# Patient Record
Sex: Male | Born: 1950 | Race: White | Hispanic: No | Marital: Married | Smoking: Former smoker
Health system: Southern US, Community
[De-identification: ages and names within clinical notes are randomized; demographics above are authoritative.]

## PROBLEM LIST (undated history)

## (undated) DIAGNOSIS — F419 Anxiety disorder, unspecified: Secondary | ICD-10-CM

## (undated) DIAGNOSIS — M1990 Unspecified osteoarthritis, unspecified site: Secondary | ICD-10-CM

## (undated) DIAGNOSIS — E7800 Pure hypercholesterolemia, unspecified: Secondary | ICD-10-CM

## (undated) DIAGNOSIS — E119 Type 2 diabetes mellitus without complications: Secondary | ICD-10-CM

## (undated) DIAGNOSIS — G4730 Sleep apnea, unspecified: Secondary | ICD-10-CM

## (undated) DIAGNOSIS — M792 Neuralgia and neuritis, unspecified: Secondary | ICD-10-CM

## (undated) HISTORY — PX: OTHER SURGICAL HISTORY: SHX169

## (undated) HISTORY — DX: Pure hypercholesterolemia, unspecified: E78.00

## (undated) HISTORY — DX: Neuralgia and neuritis, unspecified: M79.2

## (undated) HISTORY — PX: BACK SURGERY: SHX140

## (undated) HISTORY — DX: Type 2 diabetes mellitus without complications: E11.9

---

## 2008-02-21 ENCOUNTER — Encounter: Admission: RE | Admit: 2008-02-21 | Discharge: 2008-02-21 | Payer: Self-pay | Admitting: Family Medicine

## 2008-06-19 ENCOUNTER — Encounter: Admission: RE | Admit: 2008-06-19 | Discharge: 2008-06-19 | Payer: Self-pay | Admitting: Family Medicine

## 2010-04-27 ENCOUNTER — Encounter
Admission: RE | Admit: 2010-04-27 | Discharge: 2010-05-19 | Payer: Self-pay | Source: Home / Self Care | Attending: Neurology | Admitting: Neurology

## 2010-05-22 HISTORY — PX: OTHER SURGICAL HISTORY: SHX169

## 2010-05-23 ENCOUNTER — Encounter
Admission: RE | Admit: 2010-05-23 | Discharge: 2010-06-14 | Payer: Self-pay | Source: Home / Self Care | Attending: Neurology | Admitting: Neurology

## 2010-06-07 ENCOUNTER — Encounter: Admit: 2010-06-07 | Payer: Self-pay | Admitting: Neurology

## 2010-08-18 ENCOUNTER — Other Ambulatory Visit (HOSPITAL_COMMUNITY): Payer: Self-pay | Admitting: Neurology

## 2010-08-18 DIAGNOSIS — M549 Dorsalgia, unspecified: Secondary | ICD-10-CM

## 2010-08-19 ENCOUNTER — Ambulatory Visit (HOSPITAL_COMMUNITY): Payer: Self-pay

## 2010-08-19 ENCOUNTER — Other Ambulatory Visit (HOSPITAL_COMMUNITY): Payer: Self-pay | Admitting: Interventional Radiology

## 2010-08-19 ENCOUNTER — Ambulatory Visit (HOSPITAL_COMMUNITY)
Admission: RE | Admit: 2010-08-19 | Discharge: 2010-08-19 | Disposition: A | Discharge status: Home / Self Care | Payer: 59 | Source: Ambulatory Visit | Attending: Neurology | Admitting: Neurology

## 2010-08-19 DIAGNOSIS — M549 Dorsalgia, unspecified: Secondary | ICD-10-CM

## 2010-08-19 DIAGNOSIS — I770 Arteriovenous fistula, acquired: Secondary | ICD-10-CM

## 2010-08-23 ENCOUNTER — Other Ambulatory Visit (HOSPITAL_COMMUNITY): Payer: Self-pay | Admitting: Interventional Radiology

## 2010-08-23 ENCOUNTER — Encounter (HOSPITAL_COMMUNITY)
Admission: RE | Admit: 2010-08-23 | Discharge: 2010-08-23 | Disposition: A | Discharge status: Home / Self Care | Payer: 59 | Source: Ambulatory Visit | Attending: Interventional Radiology | Admitting: Interventional Radiology

## 2010-08-23 DIAGNOSIS — I10 Essential (primary) hypertension: Secondary | ICD-10-CM

## 2010-08-23 LAB — DIFFERENTIAL
Basophils Relative: 1 % (ref 0–1)
Eosinophils Absolute: 0.4 10*3/uL (ref 0.0–0.7)
Eosinophils Relative: 5 % (ref 0–5)
Monocytes Relative: 10 % (ref 3–12)
Neutro Abs: 3.7 10*3/uL (ref 1.7–7.7)
Neutrophils Relative %: 44 % (ref 43–77)

## 2010-08-23 LAB — CBC
HCT: 44.1 % (ref 39.0–52.0)
Hemoglobin: 15.8 g/dL (ref 13.0–17.0)
MCHC: 35.8 g/dL (ref 30.0–36.0)
MCV: 90 fL (ref 78.0–100.0)
WBC: 8.4 10*3/uL (ref 4.0–10.5)

## 2010-08-23 LAB — BASIC METABOLIC PANEL
Calcium: 9.8 mg/dL (ref 8.4–10.5)
Chloride: 104 mEq/L (ref 96–112)
Creatinine, Ser: 0.81 mg/dL (ref 0.4–1.5)
GFR calc Af Amer: >60 mL/min (ref >60)
Potassium: 4 mEq/L (ref 3.5–5.1)

## 2010-08-23 LAB — APTT: aPTT: 28 seconds (ref 24–37)

## 2010-08-23 LAB — CK TOTAL AND CKMB (NOT AT ARMC): CK, MB: 11.5 ng/mL (ref 0.3–4.0)

## 2010-08-24 ENCOUNTER — Other Ambulatory Visit: Payer: Self-pay | Admitting: Interventional Radiology

## 2010-08-24 ENCOUNTER — Ambulatory Visit (HOSPITAL_COMMUNITY)
Admission: RE | Admit: 2010-08-24 | Discharge: 2010-08-24 | Disposition: A | Discharge status: Home / Self Care | Payer: 59 | Source: Ambulatory Visit | Attending: Interventional Radiology | Admitting: Interventional Radiology

## 2010-08-24 DIAGNOSIS — Z01812 Encounter for preprocedural laboratory examination: Secondary | ICD-10-CM | POA: Insufficient documentation

## 2010-08-24 DIAGNOSIS — Z7901 Long term (current) use of anticoagulants: Secondary | ICD-10-CM | POA: Insufficient documentation

## 2010-08-24 DIAGNOSIS — Z86718 Personal history of other venous thrombosis and embolism: Secondary | ICD-10-CM | POA: Insufficient documentation

## 2010-08-24 DIAGNOSIS — E119 Type 2 diabetes mellitus without complications: Secondary | ICD-10-CM | POA: Insufficient documentation

## 2010-08-24 DIAGNOSIS — G609 Hereditary and idiopathic neuropathy, unspecified: Secondary | ICD-10-CM | POA: Insufficient documentation

## 2010-08-24 DIAGNOSIS — Z0389 Encounter for observation for other suspected diseases and conditions ruled out: Secondary | ICD-10-CM | POA: Insufficient documentation

## 2010-08-24 DIAGNOSIS — I770 Arteriovenous fistula, acquired: Secondary | ICD-10-CM

## 2010-08-24 DIAGNOSIS — I10 Essential (primary) hypertension: Secondary | ICD-10-CM | POA: Insufficient documentation

## 2010-08-24 DIAGNOSIS — I723 Aneurysm of iliac artery: Secondary | ICD-10-CM | POA: Insufficient documentation

## 2010-08-24 LAB — GLUCOSE, CAPILLARY: Glucose-Capillary: 136 mg/dL — ABNORMAL HIGH (ref 70–99)

## 2010-08-24 MED ORDER — IOHEXOL 300 MG/ML  SOLN
450.0000 mL | Freq: Once | INTRAMUSCULAR | Status: AC | PRN
  Administered 2010-08-24: 200 mL via INTRA_ARTERIAL

## 2010-08-25 LAB — CK TOTAL AND CKMB (NOT AT ARMC): CK, MB: 11.3 ng/mL (ref 0.3–4.0)

## 2010-08-29 NOTE — H&P (Signed)
NAME:  Kenneth Thompson, Kenneth Thompson                ACCOUNT NO.:  [id number]  MEDICAL RECORD NO.:  [id number]           PATIENT TYPE:  O  LOCATION:  [facility]                         FACILITY:  [facility]  PHYSICIAN:  Kaleisha Bhargava K. 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  HISTORY OF PRESENT ILLNESS:  This is a pleasant 60 year old male who was referred to Dr. Corliss Skains through the courtesy of Dr. Terrace Arabia for evaluation of a gait disturbance, which the patient noted after having ankle surgery in April 2009.  The patient does not feel that his gait disturbance was related to the ankle surgery itself.  He notes numbness and tingling of his toes, the right greater than the left as well as balance problems and weakness of the lower extremities.  The patient apparently has fallen several times.  He had a nerve conduction study performed on April 25, 2010 that was interpreted as mildly abnormal.  He had an MRI on August 17, 2010 that showed a spinal dural arteriovenous fistula.  The patient was seen in consultation by Dr. Corliss Skains on August 18, 2010.  At that time, a spinal arteriogram was discussed as well as possible embolization for the arteriovenous fistula if felt to be safe and indicated.  The patient presents today for admission.  PAST MEDICAL HISTORY:  Significant for diabetes mellitus, hyperlipidemia, hypertension, low back pain, DJD of the spine, a history of left ankle injury with subsequent surgery in April 2009 performed by Dr. Aldean Baker.  The patient also had a crush injury to his right leg approximately 12 years ago.  He developed a DVT following the injury and was placed on Coumadin and Lovenox for a time.  When seen in consultation on August 18, 2010 by Dr. Corliss Skains, there was some concern that  possibly the patient's statin medication was contributing to his lower extremity symptoms.  We ordered a CPK, however, a CK-MB was performed.  The CK portion was normal although the MB portion was elevated.  This was discussed informally with a local cardiologist who recommended repeating the study along with a troponin to rule out any possible cardiac issues.  Those studies are pending at this time.  PAST SURGICAL HISTORY:  The patient had right ankle surgery in April 2009.  He denies any previous problems with anesthesia.  ALLERGIES:  NO KNOWN DRUG ALLERGIES.  CURRENT MEDICATIONS:  The patient had been on Lipitor; however, he and his wife report that he stopped taking this medication last week after they read some information on the internet.  He is also on potassium chloride, fish oil, aspirin 81 mg daily, metformin which was held, ramipril, atenolol/chlorthalidone, and Tylenol and ibuprofen p.r.n.  SOCIAL HISTORY:  The patient is married.  He and his wife live in [place].  They have 2 sons.  He smokes 2 cigarettes per day and has done so since age 60.  He drinks an occasional mixed drink.  He works  part-time for the [employer] and [employer].  He previously worked  in [employer], water quality.  FAMILY HISTORY:  The patient's mother died from cancer at age 87.  His father died from cancer at age 40.  REVIEW OF SYSTEMS:  Review of systems is completely negative, except for the following:  The patient has had a dry cough.  He has had some recent diarrhea.  He has urinary hesitancy.  He has bilateral lower extremity weakness and numbness.  He has diabetes.  He has gait instability.  PHYSICAL EXAMINATION:  GENERAL:  A pleasant well-developed, well- nourished, 60 year old white male in no acute distress. VITAL SIGNS:  Blood pressure 132/81, pulse 72, respirations 18, temperature 98.2, oxygen saturation 96%. HEENT:  Unremarkable. Heart:  Regular rate and rhythm. LUNGS:  Decreased  breath sounds, although they were clear. ABDOMEN:  Soft, nontender. EXTREMITIES:  Pulses noted to be intact with trace edema.   His airway was rated at I.  His ASA scale was rated at III. NEUROLOGIC:  The patient is alert and oriented and able to follow commands.  Cranial nerves II through XII are grossly intact.  Sensation was intact to light touch, although somewhat diminished in the feet. Cerebellar testing was intact.  Motor strength was 5/5 throughout.  LABORATORY DATA:  A presurgical screen was positive for Staph aureus, negative for MRSA.  A basic metabolic panel revealed a BUN of 13, creatinine 0.81, potassium 4.0, glucose 109, GFR was greater than 60.  A PTT was 28.  A protime was 13.1 with an INR of 0.97.  A CBC revealed hemoglobin 15.8, hematocrit 44.1, WBCs 8.4 thousand, platelets 193,000. A CPK-MB revealed a CK of 188, the MB portion was elevated at 11.5, the relative index was elevated at 6.1.  There is a repeat CK-MB as well as a repeat troponin pending.  IMPRESSION: 1. Lower extremity weakness with an MRI performed on August 17, 2010     revealing a spinal dural arteriovenous fistula. 2. Diabetes mellitus. 3. Hyperlipidemia. 4. Hypertension. 5. History of low back pain. 6. Degenerative joint disease of the spine. 7. History of a crush injury to his right lower extremity     approximately 12 years ago with subsequent DVT treated with     Coumadin and Lovenox. 8. History of right ankle injury with surgery in April 2009 performed     by Dr. Aldean Baker. 9. Ongoing tobacco use. 10.Elevated CK-MB with repeat pending.     Delton See, P.A.   ______________________________ Grandville Silos. Corliss Skains, M.D.    DR/MEDQ  D:  08/24/2010  T:  08/24/2010  Job:  [id number]  cc:   Jonny Ruiz L. Rendall, M.D. Gretta Arab Valentina Lucks, M.D. Levert Feinstein, MD  Electronically Signed by Delton See P.A. on 08/25/2010 10:03:09 AM Electronically Signed by Julieanne Cotton M.D. on 08/29/2010  01:26:01 PM

## 2010-08-30 ENCOUNTER — Other Ambulatory Visit (HOSPITAL_COMMUNITY): Payer: Self-pay | Admitting: Interventional Radiology

## 2010-08-30 ENCOUNTER — Ambulatory Visit (HOSPITAL_COMMUNITY)
Admission: RE | Admit: 2010-08-30 | Discharge: 2010-08-30 | Disposition: A | Discharge status: Home / Self Care | Payer: 59 | Source: Ambulatory Visit | Attending: Interventional Radiology | Admitting: Interventional Radiology

## 2010-08-30 DIAGNOSIS — I770 Arteriovenous fistula, acquired: Secondary | ICD-10-CM

## 2010-08-31 LAB — POCT ACTIVATED CLOTTING TIME: Activated Clotting Time: 158 seconds

## 2012-08-09 ENCOUNTER — Other Ambulatory Visit: Payer: Self-pay | Admitting: Neurology

## 2012-08-12 ENCOUNTER — Other Ambulatory Visit: Payer: Self-pay | Admitting: Neurology

## 2012-09-07 ENCOUNTER — Other Ambulatory Visit: Payer: Self-pay | Admitting: Neurology

## 2013-01-11 ENCOUNTER — Encounter: Payer: Self-pay | Admitting: Nurse Practitioner

## 2013-01-15 ENCOUNTER — Encounter: Payer: Self-pay | Admitting: Nurse Practitioner

## 2013-01-15 ENCOUNTER — Ambulatory Visit (INDEPENDENT_AMBULATORY_CARE_PROVIDER_SITE_OTHER): Payer: Medicare Other | Admitting: Nurse Practitioner

## 2013-01-15 VITALS — BP 118/72 | HR 55 | Ht 76.0 in | Wt 283.0 lb

## 2013-01-15 DIAGNOSIS — R269 Unspecified abnormalities of gait and mobility: Secondary | ICD-10-CM

## 2013-01-15 DIAGNOSIS — R209 Unspecified disturbances of skin sensation: Secondary | ICD-10-CM

## 2013-01-15 DIAGNOSIS — M5104 Intervertebral disc disorders with myelopathy, thoracic region: Secondary | ICD-10-CM | POA: Insufficient documentation

## 2013-01-15 NOTE — Progress Notes (Signed)
Reason for visit followup for neuropathic pain involving bilateral lower extremities  HPI: Kenneth Thompson,  62 year old Caucasian  male, returns for followup with his wife .Last seen 07/18/12.   01/15/13.  Pt denies urinary symptoms today, continues with burning pain on the soles of the feet that is well controlled with meds. He continues to have edema in the right ankle previous ankle reconstruction surgery. Is currently not exercising but has lost 15 lbs by dieting.   Patient has disability. He also has history of hypertension and diabetes and hyperlipidemia. He denies any falls since last seen, he occasionally uses a cane     HISTORY: 09/01/10 MRI thoracic spine From T5-T11, there is intramedullary spinal cord edema, with enhancing intradural dilated venous plexus posteriorly.  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  Inflammatory and neoplastic etiologies are less likely. In comparison to the prior MRI from 08/10/10, the spinal cord edema is slightly increased. He had angiogram by Dr. Launa Flight, angiographically no evidence of early arteriovenous shunting in the spinous axis from the craniovertebral junction to the lumbosacral region noted. No abnormal early prominent venous channels are seen in the midline or in the paramidline region of the cranial spinous axis.  patient remains symptomatic, no incontinence, but complains bilateral feet burning pain, slow progress gait difficulty, no arm symptoms.  He underwent thoracic AVM malformation at [facility] by Dr. Arlyss Queen, postsurgically, he can ambulate better, but he gradually developed bilateral lower extremity burning achy pain, bandlike sensation around his lower abdomen, urinary urgency,   He suffered right ankle twisting injury at the beginning of 2009., his right ankle underwent inversion and plantar flexion.  He later underwent  right lateral ankle reconstruction surgery, "things never been the same ever  since"  MRI lumbar at [facility] image in 02/2010 showed mild DJD, but no foraminal and canal stenosis.   ROS:  Swelling in the legs, snoring, joint pain joint swelling   Medications Current Outpatient Prescriptions on File Prior to Visit  Medication Sig Dispense Refill  . aspirin 81 MG tablet Take 81 mg by mouth daily.      Marland Kitchen atenolol-chlorthalidone (TENORETIC) 50-25 MG per tablet Take 1 tablet by mouth daily.      . baclofen (LIORESAL) 10 MG tablet TITRATING TO TAKE 1 TABLET BY MOUTH 3 TIMES DAILY.  90 tablet  11  . clonazePAM (KLONOPIN) 1 MG tablet Take 1 mg by mouth at bedtime.      . fish oil-omega-3 fatty acids 1000 MG capsule Take by mouth daily.      Marland Kitchen gabapentin (NEURONTIN) 600 MG tablet TAKE TWO TABLETS BY MOUTH THREE TIMES DAILY  180 tablet  11  . lidocaine (LMX) 4 % cream Apply topically 3 (three) times daily.      . metFORMIN (GLUCOPHAGE) 500 MG tablet Take 500 mg by mouth 2 (two) times daily with a meal.       No current facility-administered medications on file prior to visit.    Allergies No Known Allergies  Physical Exam General: well developed, well nourished, obese male  in no evident distress Head: head normocephalic and atraumatic. Oropharynx benign Neck: supple with no carotid  bruits Cardiovascular: regular rate and rhythm, no murmurs  Neurologic Exam Mental Status: Awake and fully alert. Oriented to place and time. Follows all commands. Speech and language normal.   Cranial Nerves: Fundoscopic exam reveals sharp disc margins. Pupils equal, briskly reactive to light. Extraocular movements full without nystagmus. Visual  fields full to confrontation. Hearing intact and symmetric to finger snap. Facial sensation intact. Face, tongue, palate move normally and symmetrically. Neck flexion and extension normal.  Motor: Normal bulk and tone. Normal strength in all tested extremity muscles except mild bilateral lower extremities as to 30, mild limitation of right ankle range  of motion Sensory.: Decreased light touch and pinprick to about the ankle level, decreased vibratory to ankle level   Coordination: Rapid alternating movements normal in all extremities. Finger-to-nose and heel-to-shin performed accurately bilaterally. No dysmetria Gait and Station: Arises from chair without difficulty. Wide-based stiff, atalgic gait.   Reflexes: 2+ and symmetric except 3+ patellar and absent Achilles. Toes downgoing.     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 Neuropathic pain involving the  lower extremities     PLAN: Continue Gabapentin at current dose Continue Baclofen at current dose Continue Gemfibrozil at current dose Please have labs sent to our office Follow up about abnormal sleep study,  Has never been fitted for CPAP Congratulated on weight loss F/U in 6 months Kenneth Thompson, GNP-BC APRN

## 2013-01-15 NOTE — Patient Instructions (Addendum)
Continue Gabapentin at current dose Continue Baclofen at current dose Continue Gemfibrozil at current dose Please have labs sent to our office Follow up about abnormal sleep study F/U in 6 months

## 2013-01-17 NOTE — Progress Notes (Signed)
I reviewed note and agree with plan.   Suanne Marker, MD 01/17/2013, 5:06 PM Certified in Neurology, Neurophysiology and Neuroimaging  [facility] [address], [address] [place], [place] [address] [phone]

## 2013-07-18 ENCOUNTER — Ambulatory Visit: Payer: Medicare Other | Admitting: Neurology

## 2013-08-08 ENCOUNTER — Other Ambulatory Visit: Payer: Self-pay | Admitting: Neurology

## 2013-09-08 ENCOUNTER — Other Ambulatory Visit: Payer: Self-pay | Admitting: Neurology

## 2013-09-15 ENCOUNTER — Ambulatory Visit (INDEPENDENT_AMBULATORY_CARE_PROVIDER_SITE_OTHER): Payer: Medicare Other | Admitting: Neurology

## 2013-09-15 ENCOUNTER — Encounter: Payer: Self-pay | Admitting: Neurology

## 2013-09-15 ENCOUNTER — Encounter (INDEPENDENT_AMBULATORY_CARE_PROVIDER_SITE_OTHER): Payer: Self-pay

## 2013-09-15 VITALS — BP 111/68 | HR 69 | Ht 76.0 in | Wt 284.0 lb

## 2013-09-15 DIAGNOSIS — R209 Unspecified disturbances of skin sensation: Secondary | ICD-10-CM

## 2013-09-15 DIAGNOSIS — R269 Unspecified abnormalities of gait and mobility: Secondary | ICD-10-CM

## 2013-09-15 DIAGNOSIS — M5104 Intervertebral disc disorders with myelopathy, thoracic region: Secondary | ICD-10-CM

## 2013-09-15 MED ORDER — NORTRIPTYLINE HCL 25 MG PO CAPS: ORAL_CAPSULE | ORAL | Status: DC

## 2013-09-15 MED ORDER — GABAPENTIN 600 MG PO TABS: 1200.0000 mg | ORAL_TABLET | Freq: Three times a day (TID) | ORAL | Status: DC

## 2013-09-15 MED ORDER — BACLOFEN 10 MG PO TABS: 10.0000 mg | ORAL_TABLET | Freq: Three times a day (TID) | ORAL | Status: DC

## 2013-09-15 NOTE — Progress Notes (Signed)
Reason for visit followup for neuropathic pain involving bilateral lower extremities  HPI: [name],  63 year old Caucasian  male, returns for followup with his wife .Last seen 07/18/12.   01/15/13.  Pt denies urinary symptoms today, continues with burning pain on the soles of the feet that is well controlled with meds. He continues to have edema in the right ankle previous ankle reconstruction surgery. Is currently not exercising but has lost 15 lbs by dieting.   Patient has disability. He also has history of hypertension and diabetes and hyperlipidemia. He denies any falls since last seen, he occasionally uses a cane     HISTORY:   There is a 63 years old gentleman presented with gradual onset of gait difficulty since right ankle injury at the beginning of 2009., his right ankle underwent inversion and plantar flexion.  He later underwent  right lateral ankle reconstruction surgery, "things never been the same ever since"    He had gradual onset worsening gait difficulty, no incontinence  MRI lumbar at [facility] image in 02/2010 showed mild DJD, but no foraminal and canal stenosis.  MRI thoracic spine showed T5-T11, there is intramedullary spinal cord edema, with enhancing intradural dilated venous plexus posteriorly.  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   He had angiogram by Dr. Launa Flighteveshawer, angiographically no evidence of early arteriovenous shunting in the spinous axis from the craniovertebral junction to the lumbosacral region noted. No abnormal early prominent venous channels are seen in the midline or in the paramidline region of the cranial spinous axis.   He underwent thoracic AVM malformation [facility] by Dr. Arlyss QueenZomorodi, postsurgically, he can ambulate better, but he gradually developed bilateral lower extremity burning achy pain, bandlike sensation around his lower abdomen, urinary urgency  UPDATE April  27th 2015: He is in a research study related to diabetes, he has occasionally bowel accident when he has diarrhea, he has frequent urinations, occasionally difficulty emptying his bladder,  He now complains eight-month history of worsening bilateral feet paresthesia, at nighttime she will freezing, during the daytime burning sensation, also complains of worsening gait difficulty.  He is taking Neurontin 600 mg 2 tablets 3 times a day, which does help his back paresthesia, he is also taking baclofen 10 mg 3 times a day,  ROS:  Cold intolerance, heat intolerance, diarrhea, restless leg, apnea, frequent awakening, daytime sleepiness, incontinence of bladder, walking difficulty, memory loss, dizziness, numbness, weakness, tremor, agitation, behavior problem, confusion, depression anxiety  Medications Current Outpatient Prescriptions on File Prior to Visit  Medication Sig Dispense Refill  . aspirin 81 MG tablet Take 81 mg by mouth daily.      Marland Kitchen. atenolol-chlorthalidone (TENORETIC) 50-25 MG per tablet Take 1 tablet by mouth daily.      . baclofen (LIORESAL) 10 MG tablet Take 1 tablet (10 mg total) by mouth 3 (three) times daily.  90 tablet  1  . fish oil-omega-3 fatty acids 1000 MG capsule Take by mouth daily.      Marland Kitchen. gabapentin (NEURONTIN) 600 MG tablet Take two tablets by mouth three times daily  180 tablet  0  . gemfibrozil (LOPID) 600 MG tablet       . lidocaine (LMX) 4 % cream Apply topically 3 (three) times daily.      Marland Kitchen. losartan (COZAAR) 100 MG tablet       . metFORMIN (GLUCOPHAGE) 500 MG tablet Take 500 mg by mouth 2 (two) times daily with  a meal.      . ONE TOUCH ULTRA TEST test strip        No current facility-administered medications on file prior to visit.    Allergies No Known Allergies  Physical Exam General: well developed, well nourished, obese male  in no evident distress Head: head normocephalic and atraumatic. Oropharynx benign Neck: supple with no carotid   bruits Cardiovascular: regular rate and rhythm, no murmurs  Neurologic Exam Mental Status: Awake and fully alert. Oriented to place and time. Follows all commands. Speech and language normal.   Cranial Nerves: Fundoscopic exam reveals sharp disc margins. Pupils equal, briskly reactive to light. Extraocular movements full without nystagmus. Visual fields full to confrontation. Hearing intact and symmetric to finger snap. Facial sensation intact. Face, tongue, palate move normally and symmetrically. Neck flexion and extension normal.  Motor: Normal bulk and tone. Normal strength in all tested extremity muscles except mild bilateral lower extremities spasticity,imitation of right ankle range of motion, bilateral toe flexion Sensory.: Decreased light touch and pinprick to about the ankle level, decreased vibratory to  mid shin level   Coordination: Rapid alternating movements normal in all extremities. Finger-to-nose and heel-to-shin performed accurately bilaterally. No dysmetria Gait and Station: Arises from chair without difficulty. Wide-based stiff, atalgic gait.   Reflexes: 2+ and symmetric except 3+ patellar and absent Achilles. Toes downgoing.   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  Neuropathic pain involving bilateral lower extremities  Continue Gabapentin 600 mg 2 tablets 3 times a day  Continue Baclofen  10 mg 3 times a day  Add on nortriptyline titrating to 25 mg 2 tablets every night Physical therapy BOTOX for bilateral toes flexion MRI thoracic and lumbar spine

## 2013-09-17 ENCOUNTER — Ambulatory Visit
Admission: RE | Admit: 2013-09-17 | Discharge: 2013-09-17 | Disposition: A | Discharge status: Home / Self Care | Payer: BC Managed Care – PPO | Source: Ambulatory Visit | Attending: Neurology | Admitting: Neurology

## 2013-09-17 DIAGNOSIS — R209 Unspecified disturbances of skin sensation: Secondary | ICD-10-CM

## 2013-09-17 DIAGNOSIS — R269 Unspecified abnormalities of gait and mobility: Secondary | ICD-10-CM

## 2013-09-17 DIAGNOSIS — M5104 Intervertebral disc disorders with myelopathy, thoracic region: Secondary | ICD-10-CM

## 2013-09-17 MED ORDER — GADOBENATE DIMEGLUMINE 529 MG/ML IV SOLN
20.0000 mL | Freq: Once | INTRAVENOUS | Status: AC | PRN
  Administered 2013-09-17: 20 mL via INTRAVENOUS

## 2013-09-23 ENCOUNTER — Telehealth: Payer: Self-pay | Admitting: *Deleted

## 2013-09-23 NOTE — Telephone Encounter (Signed)
Patient calling requesting MRI results.  °

## 2013-09-24 ENCOUNTER — Telehealth: Payer: Self-pay | Admitting: Neurology

## 2013-09-24 NOTE — Telephone Encounter (Signed)
I have called, left message ,[name], please call patient again,  MRI of lumbar showed degenerative disc disease result significant canal, or foraminal stenosis, MRI of thoracic spine showed postsurgical changes, a few dilated blood vessels,  No change in treatment plan,   MRI lumbar spine (without) demonstrating:  1. Multi-level facet hypertrophy, mild disc bulging at L1-2, L2-3, L4-5, with no spinal stenosis or foraminal narrowing.  2. Incidental small right renal cyst (9mm).  Posterior surgical decompression changes at T5 level.  2. Small, serpiginous flow void signals noted in the posterior intra-dural extramedullary CSF space, from T3 to T8, consistent with spinal AVM.  3. No intrinsic, compressive or abnormal enhancing spinal cord lesions.  4. Compared to MRI from 08/10/10, spinal cord edema has resolved, fewer dilated blood vessels from AVM are seen, and post-surgical changes are a new finding.

## 2013-09-24 NOTE — Telephone Encounter (Signed)
Patient requesting a call back regarding his MRI results. He did come by the office on Tuesday and picked up the results but he cannot understand the terminology. He would like someone to explain the results to him. Please call.

## 2013-09-25 ENCOUNTER — Other Ambulatory Visit: Payer: 59

## 2013-09-26 NOTE — Telephone Encounter (Signed)
Spoke to patient and relayed MRI lumbar and thoracic results, per Dr. Terrace ArabiaYan.  Patient expressed understanding, and is waiting for appointment for Botox once approved.

## 2013-10-14 ENCOUNTER — Ambulatory Visit: Payer: BC Managed Care – PPO | Attending: Neurology

## 2013-10-14 DIAGNOSIS — R262 Difficulty in walking, not elsewhere classified: Secondary | ICD-10-CM | POA: Insufficient documentation

## 2013-10-14 DIAGNOSIS — IMO0001 Reserved for inherently not codable concepts without codable children: Secondary | ICD-10-CM | POA: Diagnosis present

## 2013-10-14 DIAGNOSIS — R279 Unspecified lack of coordination: Secondary | ICD-10-CM | POA: Insufficient documentation

## 2013-10-14 DIAGNOSIS — R269 Unspecified abnormalities of gait and mobility: Secondary | ICD-10-CM | POA: Diagnosis not present

## 2013-10-14 DIAGNOSIS — R209 Unspecified disturbances of skin sensation: Secondary | ICD-10-CM | POA: Diagnosis not present

## 2013-10-15 ENCOUNTER — Ambulatory Visit: Payer: BC Managed Care – PPO

## 2013-10-15 DIAGNOSIS — IMO0001 Reserved for inherently not codable concepts without codable children: Secondary | ICD-10-CM | POA: Diagnosis not present

## 2013-10-20 ENCOUNTER — Encounter: Payer: 59 | Admitting: Physical Therapy

## 2013-10-21 ENCOUNTER — Ambulatory Visit: Payer: BC Managed Care – PPO | Attending: Neurology | Admitting: Physical Therapy

## 2013-10-21 DIAGNOSIS — R279 Unspecified lack of coordination: Secondary | ICD-10-CM | POA: Diagnosis not present

## 2013-10-21 DIAGNOSIS — IMO0001 Reserved for inherently not codable concepts without codable children: Secondary | ICD-10-CM | POA: Insufficient documentation

## 2013-10-21 DIAGNOSIS — R269 Unspecified abnormalities of gait and mobility: Secondary | ICD-10-CM | POA: Insufficient documentation

## 2013-10-21 DIAGNOSIS — R209 Unspecified disturbances of skin sensation: Secondary | ICD-10-CM | POA: Diagnosis not present

## 2013-10-21 DIAGNOSIS — R262 Difficulty in walking, not elsewhere classified: Secondary | ICD-10-CM | POA: Diagnosis not present

## 2013-10-22 ENCOUNTER — Ambulatory Visit: Payer: BC Managed Care – PPO

## 2013-10-22 DIAGNOSIS — IMO0001 Reserved for inherently not codable concepts without codable children: Secondary | ICD-10-CM | POA: Diagnosis not present

## 2013-10-27 ENCOUNTER — Ambulatory Visit: Payer: BC Managed Care – PPO

## 2013-10-29 ENCOUNTER — Ambulatory Visit: Payer: BC Managed Care – PPO

## 2013-10-29 DIAGNOSIS — IMO0001 Reserved for inherently not codable concepts without codable children: Secondary | ICD-10-CM | POA: Diagnosis not present

## 2013-11-03 ENCOUNTER — Ambulatory Visit: Payer: BC Managed Care – PPO

## 2013-11-03 DIAGNOSIS — IMO0001 Reserved for inherently not codable concepts without codable children: Secondary | ICD-10-CM | POA: Diagnosis not present

## 2013-11-04 ENCOUNTER — Telehealth: Payer: Self-pay | Admitting: Neurology

## 2013-11-04 NOTE — Telephone Encounter (Signed)
Called patient and informed him that his insurance denied the botox injections. He understood.

## 2013-11-05 ENCOUNTER — Ambulatory Visit: Payer: BC Managed Care – PPO | Admitting: Physical Therapy

## 2013-11-05 DIAGNOSIS — IMO0001 Reserved for inherently not codable concepts without codable children: Secondary | ICD-10-CM | POA: Diagnosis not present

## 2013-11-10 ENCOUNTER — Ambulatory Visit: Payer: BC Managed Care – PPO | Admitting: Physical Therapy

## 2013-11-10 DIAGNOSIS — IMO0001 Reserved for inherently not codable concepts without codable children: Secondary | ICD-10-CM | POA: Diagnosis not present

## 2013-11-17 ENCOUNTER — Ambulatory Visit: Payer: BC Managed Care – PPO

## 2014-03-06 ENCOUNTER — Other Ambulatory Visit: Payer: Self-pay

## 2014-08-23 ENCOUNTER — Other Ambulatory Visit: Payer: Self-pay | Admitting: Neurology

## 2014-09-20 ENCOUNTER — Other Ambulatory Visit: Payer: Self-pay | Admitting: Neurology

## 2014-09-21 ENCOUNTER — Telehealth: Payer: Self-pay | Admitting: Neurology

## 2014-09-21 MED ORDER — GABAPENTIN 600 MG PO TABS: 1200.0000 mg | ORAL_TABLET | Freq: Three times a day (TID) | ORAL | Status: DC

## 2014-09-21 NOTE — Telephone Encounter (Signed)
Patient is calling as he is out of his Rx gabapentin 600 mg.  He has an appointment scheduled for 09/28/2014 and is asking for a full Rx refill. Please call.

## 2014-09-21 NOTE — Telephone Encounter (Signed)
Rx has been sent.  I called back to advise.  Patient is aware.  

## 2014-09-28 ENCOUNTER — Ambulatory Visit (INDEPENDENT_AMBULATORY_CARE_PROVIDER_SITE_OTHER): Payer: BLUE CROSS/BLUE SHIELD | Admitting: Neurology

## 2014-09-28 ENCOUNTER — Encounter: Payer: Self-pay | Admitting: Neurology

## 2014-09-28 VITALS — BP 110/69 | HR 68 | Ht 76.0 in | Wt 290.0 lb

## 2014-09-28 DIAGNOSIS — R269 Unspecified abnormalities of gait and mobility: Secondary | ICD-10-CM

## 2014-09-28 DIAGNOSIS — R209 Unspecified disturbances of skin sensation: Secondary | ICD-10-CM | POA: Diagnosis not present

## 2014-09-28 DIAGNOSIS — M5104 Intervertebral disc disorders with myelopathy, thoracic region: Secondary | ICD-10-CM | POA: Diagnosis not present

## 2014-09-28 MED ORDER — OXCARBAZEPINE 150 MG PO TABS: 150.0000 mg | ORAL_TABLET | Freq: Two times a day (BID) | ORAL | Status: DC

## 2014-09-28 MED ORDER — GABAPENTIN 600 MG PO TABS: 1200.0000 mg | ORAL_TABLET | Freq: Three times a day (TID) | ORAL | Status: DC

## 2014-09-28 MED ORDER — BACLOFEN 10 MG PO TABS: 20.0000 mg | ORAL_TABLET | Freq: Three times a day (TID) | ORAL | Status: DC

## 2014-09-28 MED ORDER — CELECOXIB 100 MG PO CAPS: 100.0000 mg | ORAL_CAPSULE | Freq: Two times a day (BID) | ORAL | Status: DC

## 2014-09-28 NOTE — Progress Notes (Signed)
Reports his bilateral, lower extremity pain has become much worse. He is unable to ambulate without a cane.  He had a recent fall off his porch last week.  He is taking gabapentin 3600mg  and ibuprofen up to 4800mg  daily.  He is also using hydrocodone as needed. //mck,rn

## 2014-09-28 NOTE — Progress Notes (Signed)
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  MRI thoracic spine showed T5-T11, there is intramedullary spinal cord edema, with enhancing intradural dilated venous plexus posteriorly. 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  He had angiogram by Dr. Diamantina Monks, angiographically no evidence of early arteriovenous shunting in the spinous axis from the craniovertebral junction to the lumbosacral region noted. No abnormal early prominent venous channels are seen in the midline or in the paramidline region of the cranial spinous axis.   He underwent thoracic AVM malformation introvascular embolic surgery at [facility] in 2012 by Dr. Francesca Oman, postsurgically, he can ambulate better, but he gradually developed bilateral lower extremity burning achy pain, bandlike sensation around his lower abdomen, urinary urgency   He also has history of hypertension, diabetes, obesity,  He is currently taking Neurontin 600 mg 2 tablets 3 times a day, baclofen 10 mg 3 times a day, he complains of gradual worsening eye lateral lower extremity achy pain, like he is walking on gravels, gait difficulty, urinary urgency, bowel urgency, occasionally incontinence. He has tried nortriptyline, up to 20 mg daily, not sure about the benefit   He has been self dosing him with titrating dose of ibuprofen, up to 4800 mg daily, tends to spend a lot of time raising his leg up, or sleeping in bed,  We have reviewed MRI thoracic in April 2015, Posterior surgical decompression changes at T5 level. Small, serpiginous flow void signals noted in the posterior intra-dural extramedullary CSF space, from T3 to T8,  consistent with spinal AVM. No intrinsic, compressive or abnormal enhancing spinal cord lesions. Compared to MRI from 08/10/10, spinal cord edema has resolved, fewer dilated blood vessels from AVM are seen, and post-surgical changes are a new finding.  MRI of lumbar spine showed multilevel degenerative disc disease, no significant foraminal, or canal stenosis.  REVIEW OF SYSTEMS: Full 14 system review of systems performed and notable only for cold intolerance, heat intolerance, diarrhea, apnea, frequent awakening, daytime sleepiness, snoring, incontinence of bladder, frequent urinations, joints pain, low back pain, achy muscles, muscle cramps, walking difficulties, memory loss, dizziness, numbness, weakness, tremors, behavior problem, confusion, anxiety  ALLERGIES: No Known Allergies  HOME MEDICATIONS: Current Outpatient Prescriptions  Medication Sig Dispense Refill  . aspirin 81 MG tablet Take 81 mg by mouth daily.    Marland Kitchen atenolol-chlorthalidone (TENORETIC) 50-25 MG per tablet Take 1 tablet by mouth daily.    . baclofen (LIORESAL) 10 MG tablet Take 1 tablet (10 mg total) by mouth 3 (three) times daily. 90 tablet 12  . fish oil-omega-3 fatty acids 1000 MG capsule Take by mouth daily.    Marland Kitchen gabapentin (NEURONTIN) 600 MG tablet Take 2 tablets (1,200 mg total) by mouth 3 (three) times daily. 180 tablet 0  . gemfibrozil (LOPID) 600 MG tablet     . HYDROcodone-acetaminophen (NORCO/VICODIN) 5-325 MG per tablet   0  . ibuprofen (ADVIL,MOTRIN) 200 MG tablet Take 200 mg by mouth. Reports taking up to 1637m TID.    .Marland Kitchenlidocaine (LMX) 4 % cream Apply topically 3 (three) times daily.    .Marland Kitchenlosartan (COZAAR) 100 MG tablet     . metFORMIN (GLUCOPHAGE) 500 MG  tablet Take 500 mg by mouth 2 (two) times daily with a meal.    . ONE TOUCH ULTRA TEST test strip      No current facility-administered medications for this visit.    PAST MEDICAL HISTORY: Past Medical History  Diagnosis Date  . Diabetes   . High  cholesterol   . Neuropathic pain of lower extremity     PAST SURGICAL HISTORY: Past Surgical History  Procedure Laterality Date  . Right foot/ ankle reconstruction      FAMILY HISTORY: Family History  Problem Relation Age of Onset  . Cancer Mother   . Cancer Father     SOCIAL HISTORY:  History   Social History  . Marital Status: Married    Spouse Name: Kenneth Thompson  . Number of Children: 2  . Years of Education: 12   Occupational History  .  [employer]   Social History Main Topics  . Smoking status: Current Every Day Smoker  . Smokeless tobacco: Never Used  . Alcohol Use: Yes     Comment: 2 mix drinks a week. patient stopped 6 months ago  . Drug Use: No  . Sexual Activity: Not on file   Other Topics Concern  . Not on file   Social History Narrative   Patient is married and lives at home with wife.    Patient has a Copywriter, advertising.   Patient drinks 6-8 cups of caffeine daily.    Right-handed.           PHYSICAL EXAM   Filed Vitals:   09/28/14 1109  BP: 110/69  Pulse: 68  Height: _0  (1.93 m)  Weight: 290 lb (131.543 kg)    Not recorded      Body mass index is 35.31 kg/(m^2).  PHYSICAL EXAMNIATION:  Gen: NAD, conversant, well nourised, obese, well groomed                     Cardiovascular: Regular rate rhythm, no peripheral edema, warm, nontender. Eyes: Conjunctivae clear without exudates or hemorrhage Neck: Supple, no carotid bruise. Pulmonary: Clear to auscultation bilaterally   NEUROLOGICAL EXAM:  MENTAL STATUS: Speech:    Speech is normal; fluent and spontaneous with normal comprehension.  Cognition:    The patient is oriented to person, place, and time;     recent and remote memory intact;     language fluent;     normal attention, concentration,     fund of knowledge.  CRANIAL NERVES: CN II: Visual fields are full to confrontation. Fundoscopic exam is normal with sharp discs and no vascular changes. Venous  pulsations are present bilaterally. Pupils are 4 mm and briskly reactive to light. Visual acuity is 20/20 bilaterally. CN III, IV, VI: extraocular movement are normal. No ptosis. CN V: Facial sensation is intact to pinprick in all 3 divisions bilaterally. Corneal responses are intact.  CN VII: Face is symmetric with normal eye closure and smile. CN VIII: Hearing is normal to rubbing fingers CN IX, X: Palate elevates symmetrically. Phonation is normal. CN XI: Head turning and shoulder shrug are intact CN XII: Tongue is midline with normal movements and no atrophy.  MOTOR: There is no pronator drift of out-stretched arms. Muscle bulk and tone are normal. Muscle strength is normal at bilateral upper extremities  Mild to moderate spasticity of bilateral lower extremity, no significant weakness   REFLEXES: Reflexes are 2+ and symmetric at the biceps, triceps, 3 at knees, and  ankles. Plantar responses are extensor bilaterally   Sensory: Length dependent decreased Light touch, pinprick to distal legs decreased vibration sense  at toes COORDINATION: Rapid alternating movements and fine finger movements are intact. limited at heel-to-shin due to spasticity   GAIT/STANCE: He needs to push up to get up from seated position, wide-based, cautious, stiff gait.    DIAGNOSTIC DATA (LABS, IMAGING, TESTING) - I reviewed patient records, labs, notes, testing and imaging myself where available.  Lab Results  Component Value Date   WBC 8.4 08/23/2010   HGB 15.8 08/23/2010   HCT 44.1 08/23/2010   MCV 90.0 08/23/2010   PLT 193 08/23/2010      Component Value Date/Time   NA 137 08/23/2010 1417   K 4.0 SLIGHT HEMOLYSIS 08/23/2010 1417   CL 104 08/23/2010 1417   CO2 24 08/23/2010 1417   GLUCOSE 109* 08/23/2010 1417   BUN 13 08/23/2010 1417   CREATININE 0.81 08/23/2010 1417   CALCIUM 9.8 08/23/2010 1417   GFRNONAA >60 08/23/2010 1417   GFRAA  08/23/2010 1417    >60        The eGFR has been  calculated using the MDRD equation. This calculation has not been validated in all clinical situations. eGFR's persistently <60 mL/min signify possible Chronic Kidney Disease.    ASSESSMENT AND PLAN  Kenneth Thompson is a 64 y.o. male with thoracic AVM, status post resection, continued residual bilateral lower extremity spasticity, gait difficulty, achy pain,   1, keep gabapentin 3600 mg daily  2, increase baclofen to 10 mg 2 tablets 3 times a day  3. Add on Celebrex 100 mg twice a day stopped daily ibuprofen use  4, add on Trileptal 150 mg twice a day  5, return to clinic in 3 months, lab report from primary care     Marcial Pacas, M.D. Ph.D.  [facility] [address] [place], [place] [address] Ph: [phone] Fax: [phone]

## 2014-09-29 ENCOUNTER — Telehealth: Payer: Self-pay | Admitting: Neurology

## 2014-09-29 MED ORDER — BACLOFEN 10 MG PO TABS: 20.0000 mg | ORAL_TABLET | Freq: Three times a day (TID) | ORAL | Status: DC

## 2014-09-29 NOTE — Telephone Encounter (Signed)
Rx has been resent for #180.  I called the patient back to advise. Got no answer.  Left message.

## 2014-09-29 NOTE — Telephone Encounter (Signed)
Patient's wife is calling in regard to Rx baclofen 10 mg 3 times per day. The Rx was written for 120 tablets and should have been for 180. Please call.

## 2014-10-07 ENCOUNTER — Telehealth: Payer: Self-pay | Admitting: Neurology

## 2014-10-07 NOTE — Telephone Encounter (Signed)
I called back.  Spoke with patient.  Says when taking Celebrex he experienced burning pain in joints, hips and knees.  Says he stopped taking this med 3 days ago, and the symptoms subsided.  Questioning if an alternate drug could be prescribed to help with pain.  Please advise.  Thank you.   (Encounter was already closed when forwarded to me)

## 2014-10-07 NOTE — Telephone Encounter (Signed)
Chart reviewed, last visit Sep 28 2014, I also started on Trileptal 150 mg twice a day, increase his baclofen to 20 mg 3 times a day,  I will not add on any new medications, if needed, I may refer him to pain management

## 2014-10-07 NOTE — Telephone Encounter (Signed)
Pt called and stated that he has been experiencing negative side effects from his Rx. celecoxib (CELEBREX) 100 MG capsule would like to know if he can stop taking it and if there is another medication he can try. Please call and advise.

## 2014-10-07 NOTE — Telephone Encounter (Signed)
I called back.  Got no answer.  Left message relaying provider note.

## 2015-01-07 ENCOUNTER — Encounter: Payer: Self-pay | Admitting: Neurology

## 2015-01-07 ENCOUNTER — Ambulatory Visit (INDEPENDENT_AMBULATORY_CARE_PROVIDER_SITE_OTHER): Payer: BLUE CROSS/BLUE SHIELD | Admitting: Neurology

## 2015-01-07 VITALS — BP 111/73 | HR 64 | Ht 76.0 in | Wt 281.0 lb

## 2015-01-07 DIAGNOSIS — R269 Unspecified abnormalities of gait and mobility: Secondary | ICD-10-CM | POA: Diagnosis not present

## 2015-01-07 DIAGNOSIS — R209 Unspecified disturbances of skin sensation: Secondary | ICD-10-CM | POA: Diagnosis not present

## 2015-01-07 DIAGNOSIS — M5104 Intervertebral disc disorders with myelopathy, thoracic region: Secondary | ICD-10-CM | POA: Diagnosis not present

## 2015-01-07 MED ORDER — OXCARBAZEPINE ER 150 MG PO TB24
450.0000 mg | ORAL_TABLET | Freq: Every day | ORAL | Status: DC
Start: 2015-01-07 — End: 2015-05-18

## 2015-01-07 NOTE — Progress Notes (Signed)
Chief Complaint  Patient presents with  . Foot Pain    He is here with his wife, [name].  The pain in his feet is worse despite multiple medication changes at his last visit.  He is having more difficulty walking.       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  MRI thoracic spine showed T5-T11, there is intramedullary spinal cord edema, with enhancing intradural dilated venous plexus posteriorly. 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  He had angiogram by Dr. Diamantina Monks, angiographically no evidence of early arteriovenous shunting in the spinous axis from the craniovertebral junction to the lumbosacral region noted. No abnormal early prominent venous channels are seen in the midline or in the paramidline region of the cranial spinous axis.   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 Francesca Oman, postsurgically, he can ambulate better, but he gradually developed bilateral lower extremity burning achy pain, bandlike sensation around his lower abdomen, urinary urgency   He also has history of hypertension, diabetes, obesity,  He is currently taking Neurontin 600 mg 2 tablets 3 times a day, baclofen 10 mg 3 times a day, he complains of gradual worsening eye lateral lower extremity achy pain, like he is walking on gravels, gait difficulty, urinary urgency, bowel urgency, occasionally incontinence. He has tried nortriptyline, up to 20 mg daily, not sure about the benefit   He has been self dosing him with titrating dose of ibuprofen, up to 4800 mg daily, tends to spend a lot of time raising his leg up, or sleeping in  bed,  We have reviewed MRI thoracic in April 2015, Posterior surgical decompression changes at T5 level. Small, serpiginous flow void signals noted in the posterior intra-dural extramedullary CSF space, from T3 to T8, consistent with spinal AVM. No intrinsic, compressive or abnormal enhancing spinal cord lesions. Compared to MRI from 08/10/10, spinal cord edema has resolved, fewer dilated blood vessels from AVM are seen, and post-surgical changes are a new finding.  MRI of lumbar spine showed multilevel degenerative disc disease, no significant foraminal, or canal stenosis.  UPDATE January 07 2015: He complains of constant bilateral lower extremity achy pain, he has stopped daily ibuprofen use, Celebrex 100 mg twice a day does not help, He is also taking gabapentin 1200 mg 3 times a day, Trileptal 150 mg twice a day has helpful, he is also taking baclofen 10 mg 2 tablets 3 times a day, wife reported he tends to drink alcohol before he goes to bed,   He uses CPAP machine at night, which has his leep better.   REVIEW OF SYSTEMS: Full 14 system review of systems performed and notable only for hearing loss, excessive eating, constipation, diarrhea, apnea, daytime sleepiness, snoring, incontinence of bladder, frequent urinations, urgency, joint pain, back pain, muscle cramps, walking difficulty, memory loss, numbness, weakness, tremor, agitation, confusion, decreased concentration, anxiety  ALLERGIES: No Known Allergies  HOME MEDICATIONS: Current Outpatient Prescriptions  Medication Sig Dispense Refill  . aspirin 81 MG tablet Take 81 mg by mouth daily.    Marland Kitchen atenolol-chlorthalidone (TENORETIC) 50-25 MG per tablet Take 1 tablet by mouth daily.    Marland Kitchen  baclofen (LIORESAL) 10 MG tablet Take 2 tablets (20 mg total) by mouth 3 (three) times daily. 180 tablet 11  . celecoxib (CELEBREX) 100 MG capsule Take 1 capsule (100 mg total) by mouth 2 (two) times daily. 60 capsule 11  . gabapentin (NEURONTIN) 600 MG  tablet Take 2 tablets (1,200 mg total) by mouth 3 (three) times daily. 180 tablet 11  . gemfibrozil (LOPID) 600 MG tablet     . glimepiride (AMARYL) 4 MG tablet TAKE 1 TAB BY MOUTH ONCE DAILY WITH BREAKFAST OR FIRST MAIN MEAL OF THE DAY  6  . HYDROcodone-acetaminophen (NORCO/VICODIN) 5-325 MG per tablet   0  . losartan (COZAAR) 100 MG tablet     . metFORMIN (GLUCOPHAGE) 500 MG tablet Take 500 mg by mouth 2 (two) times daily with a meal.    . ONE TOUCH ULTRA TEST test strip     . OXcarbazepine (TRILEPTAL) 150 MG tablet Take 1 tablet (150 mg total) by mouth 2 (two) times daily. 60 tablet 4   No current facility-administered medications for this visit.    PAST MEDICAL HISTORY: Past Medical History  Diagnosis Date  . Diabetes   . High cholesterol   . Neuropathic pain of lower extremity     PAST SURGICAL HISTORY: Past Surgical History  Procedure Laterality Date  . Right foot/ ankle reconstruction      FAMILY HISTORY: Family History  Problem Relation Age of Onset  . Cancer Mother   . Cancer Father     SOCIAL HISTORY:  Social History   Social History  . Marital Status: Married    Spouse Name: [name]  . Number of Children: 2  . Years of Education: 12   Occupational History  .  [employer]   Social History Main Topics  . Smoking status: Current Every Day Smoker  . Smokeless tobacco: Never Used  . Alcohol Use: Yes     Comment: 2 mix drinks a week. patient stopped 6 months ago  . Drug Use: No  . Sexual Activity: Not on file   Other Topics Concern  . Not on file   Social History Narrative   Patient is married and lives at home with wife.    Patient has a Copywriter, advertising.   Patient drinks 6-8 cups of caffeine daily.    Right-handed.           PHYSICAL EXAM   Filed Vitals:   01/07/15 0833  BP: 111/73  Pulse: 64  Height: '6\' 4"'  (1.93 m)  Weight: 281 lb (127.461 kg)    Not recorded      Body mass index is 34.22 kg/(m^2).  PHYSICAL  EXAMNIATION:  Gen: NAD, conversant, well nourised, obese, well groomed                     Cardiovascular: Regular rate rhythm, no peripheral edema, warm, nontender. Eyes: Conjunctivae clear without exudates or hemorrhage Neck: Supple, no carotid bruise. Pulmonary: Clear to auscultation bilaterally   NEUROLOGICAL EXAM:  MENTAL STATUS: Speech:    Speech is normal; fluent and spontaneous with normal comprehension.  Cognition:    The patient is oriented to person, place, and time;     recent and remote memory intact;     language fluent;     normal attention, concentration,     fund of knowledge.  CRANIAL NERVES: CN II: Visual fields are full to confrontation. Fundoscopic exam is normal with sharp discs and no  vascular changes. Venous pulsations are present bilaterally. Pupils are 4 mm and briskly reactive to light. Visual acuity is 20/20 bilaterally. CN III, IV, VI: extraocular movement are normal. No ptosis. CN V: Facial sensation is intact to pinprick in all 3 divisions bilaterally. Corneal responses are intact.  CN VII: Face is symmetric with normal eye closure and smile. CN VIII: Hearing is normal to rubbing fingers CN IX, X: Palate elevates symmetrically. Phonation is normal. CN XI: Head turning and shoulder shrug are intact CN XII: Tongue is midline with normal movements and no atrophy.  MOTOR: There is no pronator drift of out-stretched arms. Muscle bulk and tone are normal. Muscle strength is normal at bilateral upper extremities  Mild to moderate spasticity of bilateral lower extremity, no significant weakness   REFLEXES: Reflexes are 2+ and symmetric at the biceps, triceps, 3 at knees, and ankles. Plantar responses are extensor bilaterally   Sensory: Length dependent decreased Light touch, pinprick to distal legs decreased vibration sense  at toes COORDINATION: Rapid alternating movements and fine finger movements are intact. limited at heel-to-shin due to spasticity     GAIT/STANCE: He needs to push up to get up from seated position, wide-based, cautious, stiff gait.    DIAGNOSTIC DATA (LABS, IMAGING, TESTING) - I reviewed patient records, labs, notes, testing and imaging myself where available.  Lab Results  Component Value Date   WBC 8.4 08/23/2010   HGB 15.8 08/23/2010   HCT 44.1 08/23/2010   MCV 90.0 08/23/2010   PLT 193 08/23/2010      Component Value Date/Time   NA 137 08/23/2010 1417   K 4.0 SLIGHT HEMOLYSIS 08/23/2010 1417   CL 104 08/23/2010 1417   CO2 24 08/23/2010 1417   GLUCOSE 109* 08/23/2010 1417   BUN 13 08/23/2010 1417   CREATININE 0.81 08/23/2010 1417   CALCIUM 9.8 08/23/2010 1417   GFRNONAA >60 08/23/2010 1417   GFRAA  08/23/2010 1417    >60        The eGFR has been calculated using the MDRD equation. This calculation has not been validated in all clinical situations. eGFR's persistently <60 mL/min signify possible Chronic Kidney Disease.    ASSESSMENT AND PLAN  TAHMIR KLECKNER is a 64 y.o. male with thoracic AVM, status post resection, continued residual bilateral lower extremity spasticity, gait difficulty, achy pain,   1, keep gabapentin 3600 mg daily  2, baclofen 10 mg 2 tablets, up to 4 times a day 3. Change to oxtellar 443m qhs 4. RTC in 4 months  YMarcial Pacas M.D. Ph.D.  [facility] [address] [place] [address]Ph: [phone]: [phone]

## 2015-05-18 ENCOUNTER — Ambulatory Visit (INDEPENDENT_AMBULATORY_CARE_PROVIDER_SITE_OTHER): Payer: BLUE CROSS/BLUE SHIELD | Admitting: Neurology

## 2015-05-18 ENCOUNTER — Encounter: Payer: Self-pay | Admitting: Neurology

## 2015-05-18 VITALS — BP 130/78 | HR 65 | Ht 76.0 in | Wt 286.0 lb

## 2015-05-18 DIAGNOSIS — R269 Unspecified abnormalities of gait and mobility: Secondary | ICD-10-CM

## 2015-05-18 DIAGNOSIS — M5104 Intervertebral disc disorders with myelopathy, thoracic region: Secondary | ICD-10-CM

## 2015-05-18 MED ORDER — OXCARBAZEPINE ER 150 MG PO TB24: 450.0000 mg | ORAL_TABLET | Freq: Two times a day (BID) | ORAL | Status: DC

## 2015-05-18 NOTE — Progress Notes (Signed)
Chief Complaint  Patient presents with  . abnormality of gait    Patient is here for a f/u. He states his balance is getting worse. He c/o his arms/hands going to sleep when he lays down at night.    Chief Complaint  Patient presents with  . abnormality of gait    Patient is here for a f/u. He states his balance is getting worse. He c/o his arms/hands going to sleep when he lays down at night.       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  MRI thoracic spine showed T5-T11, there is intramedullary spinal cord edema, with enhancing intradural dilated venous plexus posteriorly. 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  He had angiogram by Dr. Diamantina Monks, angiographically no evidence of early arteriovenous shunting in the spinous axis from the craniovertebral junction to the lumbosacral region noted. No abnormal early prominent venous channels are seen in the midline or in the paramidline region of the cranial spinous axis.   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 Francesca Oman, postsurgically, he can ambulate better, but he gradually developed bilateral lower extremity burning achy pain, bandlike sensation around his lower abdomen, urinary urgency   He also has history of hypertension, diabetes, obesity,  He is currently taking Neurontin 600 mg 2 tablets 3 times a day, baclofen 10 mg 3 times a day, he complains of gradual worsening eye lateral lower extremity achy pain, like he is walking on gravels, gait difficulty, urinary urgency, bowel urgency, occasionally incontinence. He has tried nortriptyline, up  to 20 mg daily, not sure about the benefit   He has been self dosing him with titrating dose of ibuprofen, up to 4800 mg daily, tends to spend a lot of time raising his leg up, or sleeping in bed,  We have reviewed MRI thoracic in April 2015, Posterior surgical decompression changes at T5 level. Small, serpiginous flow void signals noted in the posterior intra-dural extramedullary CSF space, from T3 to T8, consistent with spinal AVM. No intrinsic, compressive or abnormal enhancing spinal cord lesions. Compared to MRI from 08/10/10, spinal cord edema has resolved, fewer dilated blood vessels from AVM are seen, and post-surgical changes are a new finding.  MRI of lumbar spine showed multilevel degenerative disc disease, no significant foraminal, or canal stenosis.  UPDATE January 07 2015: He complains of constant bilateral lower extremity achy pain, he has stopped daily ibuprofen use, Celebrex 100 mg twice a day does not help, He is also taking gabapentin 1200 mg 3 times a day, Trileptal 150 mg twice a day has helpful, he is also taking baclofen 10 mg 2 tablets 3 times a day, wife reported he tends to drink alcohol before he goes to bed,   He uses CPAP machine at night, which has him sleep better.  Update May 18 2015: His balance is gradaully getting worse, he also complains of pain at the bottom of his feet, like walking on pebbles, he woke up frequently at night time, occasionally nocturnal urinary incontinence, daytime urinary frequency, hesitation,  He is now taking gabaentin up to 3600 mg daily, Oxtellar 128m iii bid  and baclofen 53m tid.   REVIEW OF SYSTEMS: Full 14 system review of systems performed and notable only for fatigue, hearing loss, running nose, eye redness, blurred vision, shortness of breath, leg swelling, cold intolerance, heat intolerance, excessive eating, rectal bleeding, constipation, diarrhea, incontinence of bowels, restless leg, apnea, frequent awakening, daytime  sleepiness, snoring, environmental allergy, difficulty urinating, incontinence of bladder, frequent urination, urgency, joint pain, joint swelling, back pain, achy muscles, muscle cramps, walking difficulty, memory loss, dizziness, numbness, weakness, tremor, agitation, behavior problem, confusion, decreased concentration, anxiety, hyperactivity,   ALLERGIES: No Known Allergies  HOME MEDICATIONS: Current Outpatient Prescriptions  Medication Sig Dispense Refill  . aspirin 81 MG tablet Take 81 mg by mouth daily.    .Marland Kitchenatenolol-chlorthalidone (TENORETIC) 50-25 MG per tablet Take 1 tablet by mouth daily.    . baclofen (LIORESAL) 10 MG tablet Take 2 tablets (20 mg total) by mouth 3 (three) times daily. 180 tablet 11  . gabapentin (NEURONTIN) 600 MG tablet Take 2 tablets (1,200 mg total) by mouth 3 (three) times daily. 180 tablet 11  . gemfibrozil (LOPID) 600 MG tablet     . glimepiride (AMARYL) 4 MG tablet TAKE 1 TAB BY MOUTH ONCE DAILY WITH BREAKFAST OR FIRST MAIN MEAL OF THE DAY  6  . HYDROcodone-acetaminophen (NORCO/VICODIN) 5-325 MG per tablet   0  . losartan (COZAAR) 100 MG tablet     . metFORMIN (GLUCOPHAGE) 500 MG tablet Take 500 mg by mouth 2 (two) times daily with a meal.    . ONE TOUCH ULTRA TEST test strip     . OXcarbazepine ER (OXTELLAR XR) 150 MG TB24 Take 450 mg by mouth at bedtime. 90 tablet 11   No current facility-administered medications for this visit.    PAST MEDICAL HISTORY: Past Medical History  Diagnosis Date  . Diabetes ([place]   . High cholesterol   . Neuropathic pain of lower extremity     PAST SURGICAL HISTORY: Past Surgical History  Procedure Laterality Date  . Right foot/ ankle reconstruction      FAMILY HISTORY: Family History  Problem Relation Age of Onset  . Cancer Mother   . Cancer Father     SOCIAL HISTORY:  Social History   Social History  . Marital Status: Married    Spouse Name: DButch Penny . Number of Children: 2  . Years of Education: 12     Occupational History  .  [employer]  Social History Main Topics  . Smoking status: Current Every Day Smoker -- 0.25 packs/day  . Smokeless tobacco: Never Used  . Alcohol Use: 0.0 oz/week    0 Standard drinks or equivalent per week     Comment: fifth per week  . Drug Use: No  . Sexual Activity: Not on file   Other Topics Concern  . Not on file   Social History Narrative   Patient is married and lives at home with wife.    Patient has a hCopywriter, advertising   Patient drinks 6-8 cups of caffeine daily.    Right-handed.           PHYSICAL EXAM   Filed Vitals:   05/18/15 0727  BP: 130/78  Pulse: 65  Height: 6' 4" (1.93 m)  Weight: 286 lb (129.729 kg)    Not recorded      Body mass index is 34.83 kg/(m^2).  PHYSICAL EXAMNIATION:  Gen: NAD, conversant, well nourised, obese, well groomed  Cardiovascular: Regular rate rhythm, no peripheral edema, warm, nontender. Eyes: Conjunctivae clear without exudates or hemorrhage Neck: Supple, no carotid bruise. Pulmonary: Clear to auscultation bilaterally   NEUROLOGICAL EXAM:  MENTAL STATUS: Speech:    Speech is normal; fluent and spontaneous with normal comprehension.  Cognition:    The patient is oriented to person, place, and time;     recent and remote memory intact;     language fluent;     normal attention, concentration,     fund of knowledge.  CRANIAL NERVES: CN II: Visual fields are full to confrontation. Fundoscopic exam is normal with sharp discs and no vascular changes. Venous pulsations are present bilaterally. Pupils are 4 mm and briskly reactive to light. Visual acuity is 20/20 bilaterally. CN III, IV, VI: extraocular movement are normal. No ptosis. CN V: Facial sensation is intact to pinprick in all 3 divisions bilaterally. Corneal responses are intact.  CN VII: Face is symmetric with normal eye closure and smile. CN VIII: Hearing is normal to rubbing fingers CN IX,  X: Palate elevates symmetrically. Phonation is normal. CN XI: Head turning and shoulder shrug are intact CN XII: Tongue is midline with normal movements and no atrophy.  MOTOR: There is no pronator drift of out-stretched arms. Muscle bulk and tone are normal. Muscle strength is normal at bilateral upper extremities  Mild to moderate spasticity of bilateral lower extremity, no significant weakness  REFLEXES: Reflexes are 2+ and symmetric at the biceps, triceps, 3 at knees, and ankles. Plantar responses are extensor bilaterally   Sensory: Length dependent decreased Light touch, pinprick to distal legs decreased vibration sense  at toes COORDINATION: Rapid alternating movements and fine finger movements are intact. limited at heel-to-shin due to spasticity   GAIT/STANCE: He needs to push up to get up from seated position, wide-based, cautious, stiff gait.    DIAGNOSTIC DATA (LABS, IMAGING, TESTING) - I reviewed patient records, labs, notes, testing and imaging myself where available.  Lab Results  Component Value Date   WBC 8.4 08/23/2010   HGB 15.8 08/23/2010   HCT 44.1 08/23/2010   MCV 90.0 08/23/2010   PLT 193 08/23/2010      Component Value Date/Time   NA 137 08/23/2010 1417   K 4.0 SLIGHT HEMOLYSIS 08/23/2010 1417   CL 104 08/23/2010 1417   CO2 24 08/23/2010 1417   GLUCOSE 109* 08/23/2010 1417   BUN 13 08/23/2010 1417   CREATININE 0.81 08/23/2010 1417   CALCIUM 9.8 08/23/2010 1417   GFRNONAA >60 08/23/2010 1417   GFRAA  08/23/2010 1417    >60        The eGFR has been calculated using the MDRD equation. This calculation has not been validated in all clinical situations. eGFR's persistently <60 mL/min signify possible Chronic Kidney Disease.    ASSESSMENT AND PLAN  HANNA AULTMAN is a 64 y.o. male with thoracic AVM, status post resection, continued residual bilateral lower extremity spasticity, gait difficulty, achy pain,   Gait difficulty  progressive  worsening, I have advised him continue moderate exercise, gait training  Bilateral lower extremity neuropathic pain  1, keep gabapentin 3600 mg daily  2, baclofen 10 mg tablet , 90 mg daily  3.increase oxtell to 450 mg twice a day  4.add on compounding cream  5. Return to clinic in 6 months    Marcial Pacas, M.D. Ph.D.  [facility] [address], [place], [place] [address] Ph: [phone] Fax: [phone]

## 2015-05-23 HISTORY — PX: EYE SURGERY: SHX253

## 2015-05-26 ENCOUNTER — Encounter: Payer: Self-pay | Admitting: *Deleted

## 2015-09-02 ENCOUNTER — Other Ambulatory Visit: Payer: Self-pay | Admitting: Neurology

## 2015-09-21 ENCOUNTER — Other Ambulatory Visit: Payer: Self-pay | Admitting: Neurology

## 2015-11-16 ENCOUNTER — Encounter: Payer: Self-pay | Admitting: Neurology

## 2015-11-16 ENCOUNTER — Ambulatory Visit (INDEPENDENT_AMBULATORY_CARE_PROVIDER_SITE_OTHER): Payer: BLUE CROSS/BLUE SHIELD | Admitting: Neurology

## 2015-11-16 VITALS — BP 129/83 | HR 64 | Ht 76.0 in | Wt 291.2 lb

## 2015-11-16 DIAGNOSIS — R269 Unspecified abnormalities of gait and mobility: Secondary | ICD-10-CM | POA: Diagnosis not present

## 2015-11-16 DIAGNOSIS — R209 Unspecified disturbances of skin sensation: Secondary | ICD-10-CM

## 2015-11-16 DIAGNOSIS — M5104 Intervertebral disc disorders with myelopathy, thoracic region: Secondary | ICD-10-CM

## 2015-11-16 NOTE — Progress Notes (Signed)
Chief Complaint  Patient presents with  . Peripheral Neuropathy    He is here with his wife, [name].  Says his neuropathic pain is worse since he was last seen.  . Gait Problem    His gait is unsteady and he is having to use a walking stick/cane for balance.  . Back/Hip Pain    He is having more back and right hip.       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  MRI thoracic spine showed T5-T11, there is intramedullary spinal cord edema, with enhancing intradural dilated venous plexus posteriorly. 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  He had angiogram by Dr. Diamantina Monks, angiographically no evidence of early arteriovenous shunting in the spinous axis from the craniovertebral junction to the lumbosacral region noted. No abnormal early prominent venous channels are seen in the midline or in the paramidline region of the cranial spinous axis.   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 Francesca Oman, postsurgically, he can ambulate better, but he gradually developed bilateral lower extremity burning achy pain, bandlike sensation around his lower abdomen, urinary urgency   He also has history of hypertension, diabetes, obesity,  He is currently taking Neurontin 600 mg 2 tablets 3 times a day, baclofen 10 mg 3 times a day, he complains of gradual worsening eye lateral lower extremity achy pain, like he is walking on gravels, gait difficulty, urinary urgency, bowel urgency, occasionally incontinence. He has tried nortriptyline, up to 20 mg daily, not sure about the benefit   He has been self dosing him with  titrating dose of ibuprofen, up to 4800 mg daily, tends to spend a lot of time raising his leg up, or sleeping in bed,  We have reviewed MRI thoracic in April 2015, Posterior surgical decompression changes at T5 level. Small, serpiginous flow void signals noted in the posterior intra-dural extramedullary CSF space, from T3 to T8, consistent with spinal AVM. No intrinsic, compressive or abnormal enhancing spinal cord lesions. Compared to MRI from 08/10/10, spinal cord edema has resolved, fewer dilated blood vessels from AVM are seen, and post-surgical changes are a new finding.  MRI of lumbar spine showed multilevel degenerative disc disease, no significant foraminal, or canal stenosis.  UPDATE January 07 2015: He complains of constant bilateral lower extremity achy pain, he has stopped daily ibuprofen use, Celebrex 100 mg twice a day does not help, He is also taking gabapentin 1200 mg 3 times a day, Trileptal 150 mg twice a day has helpful, he is also taking baclofen 10 mg 2 tablets 3 times a day, wife reported he tends to drink alcohol before he goes to bed,   He uses CPAP machine at night, which has him sleep better.  Update May 18 2015: His balance is gradaully getting worse, he also complains of pain at the bottom of his feet, like walking on pebbles, he woke up frequently at night time, occasionally nocturnal urinary incontinence, daytime urinary frequency, hesitation,  He is now taking gabaentin up to 3600 mg daily, Oxtellar 151m iii bid and baclofen 319mtid.   UPDATE June 27th 2017: He has a  lot of right hip pain, low back pain, bone pain, hurt after he walks for a while, back pain when he bending over, he can only walk 15-20 minutes, he is not active, he has not swim for 2 years,  Bilateral feet feel like in fire, burning and cold sometimes.  He is now taking gabaentin up to 3600 mg daily, Oxtellar 1101m iii bid and baclofen 317mtid.    REVIEW OF SYSTEMS: Full 14 system review of  systems performed and notable only for fatigue, hearing loss, eye redness, light sensitivity, leg swelling, restless leg, diarrhea, excessive eating, apnea frequent awakening, daytime sleepiness, snoring, difficulty urinating, incontinence of bladder, frequent urination, urgency, joint pain, joint swelling, back pain, muscle cramps, walking difficulty, dizziness, memory loss, numbness, weakness, tremor, agitation, behavior problem, confusion, decreased concentration, depression anxiety  ALLERGIES: No Known Allergies  HOME MEDICATIONS: Current Outpatient Prescriptions  Medication Sig Dispense Refill  . aspirin 81 MG tablet Take 81 mg by mouth daily.    . Marland Kitchentenolol-chlorthalidone (TENORETIC) 50-25 MG per tablet Take 1 tablet by mouth daily.    . baclofen (LIORESAL) 10 MG tablet TAKE 2 TABLETS (20 MG TOTAL) BY MOUTH 3 (THREE) TIMES DAILY. 180 tablet 11  . gabapentin (NEURONTIN) 600 MG tablet TAKE 2 TABLETS BY MOUTH THREE TIMES DAILY 180 tablet 11  . gemfibrozil (LOPID) 600 MG tablet     . glimepiride (AMARYL) 4 MG tablet TAKE 1 TAB BY MOUTH ONCE DAILY WITH BREAKFAST OR FIRST MAIN MEAL OF THE DAY  6  . HYDROcodone-acetaminophen (NORCO/VICODIN) 5-325 MG per tablet   0  . losartan (COZAAR) 100 MG tablet     . metFORMIN (GLUCOPHAGE) 500 MG tablet Take 500 mg by mouth 2 (two) times daily with a meal.    . ONE TOUCH ULTRA TEST test strip     . OXcarbazepine ER (OXTELLAR XR) 150 MG TB24 Take 450 mg by mouth 2 (two) times daily. 180 tablet 11  . UNABLE TO FIND Med Name: Amantadine 8%, Baclofen 2%, Cyclobenzaprine 2%, Diclofenac 3%, Gabapentin 6%, Bupivacaine 2%.     No current facility-administered medications for this visit.    PAST MEDICAL HISTORY: Past Medical History  Diagnosis Date  . Diabetes ([place]  . High cholesterol   . Neuropathic pain of lower extremity     PAST SURGICAL HISTORY: Past Surgical History  Procedure Laterality Date  . Right foot/ ankle reconstruction      FAMILY  HISTORY: Family History  Problem Relation Age of Onset  . Cancer Mother   . Cancer Father     SOCIAL HISTORY:  Social History   Social History  . Marital Status: Married    Spouse Name: DoButch Penny. Number of Children: 2  . Years of Education: 12   Occupational History  .  [employer] Social History Main Topics  . Smoking status: Current Every Day Smoker -- 0.25 packs/day  . Smokeless tobacco: Never Used  . Alcohol Use: 0.0 oz/week    0 Standard drinks or equivalent per week     Comment: fifth per week  . Drug Use: No  . Sexual Activity: Not on file   Other Topics Concern  . Not on file   Social History Narrative   Patient is married and lives at home with wife.    Patient has a hiCopywriter, advertising  Patient drinks 6-8 cups of caffeine daily.    Right-handed.  PHYSICAL EXAM   Filed Vitals:   11/16/15 0737  BP: 129/83  Pulse: 64  Height: '6\' 4"'  (1.93 m)  Weight: 291 lb 4 oz (132.11 kg)    Not recorded      Body mass index is 35.47 kg/(m^2).  PHYSICAL EXAMNIATION:  Gen: NAD, conversant, well nourised, obese, well groomed                     Cardiovascular: Regular rate rhythm, no peripheral edema, warm, nontender. Eyes: Conjunctivae clear without exudates or hemorrhage Neck: Supple, no carotid bruise. Pulmonary: Clear to auscultation bilaterally   NEUROLOGICAL EXAM:  MENTAL STATUS: Speech:    Speech is normal; fluent and spontaneous with normal comprehension.  Cognition:    The patient is oriented to person, place, and time;     recent and remote memory intact;     language fluent;     normal attention, concentration,     fund of knowledge.  CRANIAL NERVES: CN II: Visual fields are full to confrontation. Fundoscopic exam is normal with sharp discs and no vascular changes. Venous pulsations are present bilaterally. Pupils are 4 mm and briskly reactive to light. Visual acuity is 20/20 bilaterally. CN III, IV, VI:  extraocular movement are normal. No ptosis. CN V: Facial sensation is intact to pinprick in all 3 divisions bilaterally. Corneal responses are intact.  CN VII: Face is symmetric with normal eye closure and smile. CN VIII: Hearing is normal to rubbing fingers CN IX, X: Palate elevates symmetrically. Phonation is normal. CN XI: Head turning and shoulder shrug are intact CN XII: Tongue is midline with normal movements and no atrophy.  MOTOR: There is no pronator drift of out-stretched arms. Muscle bulk and tone are normal. Muscle strength is normal at bilateral upper extremities  Mild to moderate spasticity of bilateral lower extremity, no significant weakness  REFLEXES: Reflexes are 2+ and symmetric at the biceps, triceps, 3 at knees, and ankles. Plantar responses are extensor bilaterally   Sensory: Length dependent decreased Light touch, pinprick to distal legs decreased vibration sense  at toes COORDINATION: Rapid alternating movements and fine finger movements are intact. limited at heel-to-shin due to spasticity   GAIT/STANCE: He needs to push up to get up from seated position, wide-based, cautious, stiff gait.    DIAGNOSTIC DATA (LABS, IMAGING, TESTING) - I reviewed patient records, labs, notes, testing and imaging myself where available.  Lab Results  Component Value Date   WBC 8.4 08/23/2010   HGB 15.8 08/23/2010   HCT 44.1 08/23/2010   MCV 90.0 08/23/2010   PLT 193 08/23/2010      Component Value Date/Time   NA 137 08/23/2010 1417   K 4.0 SLIGHT HEMOLYSIS 08/23/2010 1417   CL 104 08/23/2010 1417   CO2 24 08/23/2010 1417   GLUCOSE 109* 08/23/2010 1417   BUN 13 08/23/2010 1417   CREATININE 0.81 08/23/2010 1417   CALCIUM 9.8 08/23/2010 1417   GFRNONAA >60 08/23/2010 1417   GFRAA  08/23/2010 1417    >60        The eGFR has been calculated using the MDRD equation. This calculation has not been validated in all clinical situations. eGFR's persistently <60 mL/min  signify possible Chronic Kidney Disease.    ASSESSMENT AND PLAN  SARP VERNIER is a 65 y.o. male with thoracic AVM, status post resection, continued residual bilateral lower extremity spasticity, gait difficulty, achy pain,   Gait difficulty  progressive worsening, I have advised him continue  moderate exercise, gait training  Bilateral lower extremity neuropathic pain   1, keep gabapentin 3600 mg daily   2, baclofen 10 mg tablet , 90 mg daily   3.increase oxtell to 450 mg twice a day   Right hip pain, low back pain, most likely musculoskeletal etiology    Moderate exercise  Kenneth Thompson, M.D. Ph.D.  [facility] [address] [place], [place] [address] Ph: [phone] Fax: [phone]

## 2016-02-21 ENCOUNTER — Other Ambulatory Visit: Payer: Self-pay | Admitting: *Deleted

## 2016-02-21 MED ORDER — OXCARBAZEPINE ER 150 MG PO TB24: 450.0000 mg | ORAL_TABLET | Freq: Two times a day (BID) | ORAL | 1 refills | Status: DC

## 2016-05-11 ENCOUNTER — Encounter (INDEPENDENT_AMBULATORY_CARE_PROVIDER_SITE_OTHER): Payer: Self-pay | Admitting: Orthopaedic Surgery

## 2016-05-11 ENCOUNTER — Ambulatory Visit (INDEPENDENT_AMBULATORY_CARE_PROVIDER_SITE_OTHER): Payer: BLUE CROSS/BLUE SHIELD | Admitting: Orthopaedic Surgery

## 2016-05-11 ENCOUNTER — Ambulatory Visit (INDEPENDENT_AMBULATORY_CARE_PROVIDER_SITE_OTHER): Payer: BLUE CROSS/BLUE SHIELD

## 2016-05-11 DIAGNOSIS — M25551 Pain in right hip: Secondary | ICD-10-CM

## 2016-05-11 NOTE — Progress Notes (Signed)
   Office Visit Note   Patient: Kenneth KohutLarry F Thompson           Date of Birth: 05/11/1951           MRN: [id number] Visit Date: 05/11/2016              Requested by: Maurice SmallElaine Griffin, MD [address] [place], [place] [address] PCP: Astrid DivineGRIFFIN,ELAINE COLLINS, MD   Assessment & Plan: Visit Diagnoses:  1. Pain in right hip     Plan: recommend diagnostic and hopefully therapeutic right hip injection with Dr. Alvester MorinNewton.  They have my card and will call me in about 2 weeks to let me know how he does from the injection.  Follow-Up Instructions: No Follow-up on file.   Orders:  Orders Placed This Encounter  Procedures  . XR HIP UNILAT W OR W/O PELVIS 2-3 VIEWS RIGHT  . Ambulatory referral to Physical Medicine Rehab   No orders of the defined types were placed in this encounter.     Procedures: No procedures performed   Clinical Data: No additional findings.   Subjective: Chief Complaint  Patient presents with  . Right Hip - Pain, New Patient (Initial Visit)    65 yo male with right hip pain for 2 weeks.  Denies groin pain or trauma.  Feels like it gives away with walking.  No alleviating or exacerbating features.  Has been walking with walking stick which helps.  Does not take nsaids.  Denies f/c.    Review of Systems  Constitutional: Negative.   All other systems reviewed and are negative.    Objective: Vital Signs: There were no vitals taken for this visit.  Physical Exam  Constitutional: He is oriented to person, place, and time. He appears well-developed and well-nourished.  Pulmonary/Chest: Effort normal.  Abdominal: Soft.  Neurological: He is alert and oriented to person, place, and time.  Skin: Skin is warm.  Psychiatric: He has a normal mood and affect. His behavior is normal. Judgment and thought content normal.  Nursing note and vitals reviewed.   Ortho Exam Right hip exam shows good ROM without significant pain.  I cannot reproduce his pain with  any maneuvers or with palpation.  His back exam is also benign.   Specialty Comments:  No specialty comments available.  Imaging: Xr Hip Unilat W Or W/o Pelvis 2-3 Views Right  Result Date: 05/11/2016 No significant degenerative joint disease    PMFS History: Patient Active Problem List   Diagnosis Date Noted  . 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 Disturbance of skin sensation 01/15/2013  . Abnormality of gait 01/15/2013   Past Medical History:  Diagnosis Date  . Diabetes (HCC)   . High cholesterol   . Neuropathic pain of lower extremity     Family History  Problem Relation Age of Onset  . Cancer Mother   . Cancer Father     Past Surgical History:  Procedure Laterality Date  . Right foot/ ankle reconstruction     Social History   Occupational History  .  [employer]   Social History Main Topics  . Smoking status: Current Every Day Smoker    Packs/day: 0.25  . Smokeless tobacco: Never Used  . Alcohol use 0.0 oz/week     Comment: fifth per week  . Drug use: No  . Sexual activity: Not on file

## 2016-05-19 ENCOUNTER — Telehealth: Payer: Self-pay | Admitting: Neurology

## 2016-05-19 NOTE — Telephone Encounter (Signed)
Patient reports new rt hip pain that started about 2 weeks ago.  An appointment was made for 08/01/16 but the pt is requesting to be seen sooner. Advised that the nurse would call. Pt is aware provider and RN will be back in the clinic next week and this can wait until then

## 2016-05-24 NOTE — Telephone Encounter (Signed)
Spoke to patient - his appt has been moved to an available slot on 05/25/16.

## 2016-05-24 NOTE — Telephone Encounter (Signed)
Left message for a return call

## 2016-05-24 NOTE — Telephone Encounter (Signed)
Left another message for a return call

## 2016-05-25 ENCOUNTER — Encounter: Payer: Self-pay | Admitting: Neurology

## 2016-05-25 ENCOUNTER — Ambulatory Visit (INDEPENDENT_AMBULATORY_CARE_PROVIDER_SITE_OTHER): Payer: BLUE CROSS/BLUE SHIELD | Admitting: Neurology

## 2016-05-25 VITALS — BP 119/69 | HR 66 | Ht 76.0 in | Wt 292.0 lb

## 2016-05-25 DIAGNOSIS — R269 Unspecified abnormalities of gait and mobility: Secondary | ICD-10-CM | POA: Diagnosis not present

## 2016-05-25 DIAGNOSIS — R209 Unspecified disturbances of skin sensation: Secondary | ICD-10-CM

## 2016-05-25 DIAGNOSIS — M5104 Intervertebral disc disorders with myelopathy, thoracic region: Secondary | ICD-10-CM

## 2016-05-25 MED ORDER — GABAPENTIN 600 MG PO TABS: 1200.0000 mg | ORAL_TABLET | Freq: Three times a day (TID) | ORAL | 11 refills | Status: DC

## 2016-05-25 MED ORDER — BACLOFEN 10 MG PO TABS: 30.0000 mg | ORAL_TABLET | Freq: Three times a day (TID) | ORAL | 11 refills | Status: DC

## 2016-05-25 MED ORDER — OXCARBAZEPINE ER 150 MG PO TB24: 450.0000 mg | ORAL_TABLET | Freq: Two times a day (BID) | ORAL | 4 refills | Status: DC

## 2016-05-25 NOTE — Progress Notes (Signed)
Chief Complaint  Patient presents with  . Right Hip Pain    He is here with his wife, [name], for worsening right hip pain.  He is having to use a cane to assist with walking.  He has also been evaluated by ortho.  He had a normal x-ray and is scheduled for a steroid injection next week.      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  MRI thoracic spine showed T5-T11, there is intramedullary spinal cord edema, with enhancing intradural dilated venous plexus posteriorly. 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  He had angiogram by Dr. Diamantina Monks, angiographically no evidence of early arteriovenous shunting in the spinous axis from the craniovertebral junction to the lumbosacral region noted. No abnormal early prominent venous channels are seen in the midline or in the paramidline region of the cranial spinous axis.   He underwent thoracic AVM malformation introvascular embolic surgery at [facility] in 2012 by Dr. Francesca Oman, postsurgically, he can ambulate better, but he gradually developed bilateral lower extremity burning achy pain, bandlike sensation around his lower abdomen, urinary urgency   He also has history of hypertension, diabetes, obesity,  He is currently taking Neurontin 600 mg 2 tablets 3 times a day, baclofen 10 mg 3 times a day, he complains of gradual worsening eye lateral lower extremity achy pain, like he is walking on gravels, gait difficulty, urinary urgency, bowel urgency, occasionally incontinence. He has tried nortriptyline, up to 20 mg daily, not sure about the benefit   He has been self dosing him with titrating dose of ibuprofen, up  to 4800 mg daily, tends to spend a lot of time raising his leg up, or sleeping in bed,  We have reviewed MRI thoracic in April 2015, Posterior surgical decompression changes at T5 level. Small, serpiginous flow void signals noted in the posterior intra-dural extramedullary CSF space, from T3 to T8, consistent with spinal AVM. No intrinsic, compressive or abnormal enhancing spinal cord lesions. Compared to MRI from 08/10/10, spinal cord edema has resolved, fewer dilated blood vessels from AVM are seen, and post-surgical changes are a new finding.  MRI of lumbar spine showed multilevel degenerative disc disease, no significant foraminal, or canal stenosis.  UPDATE January 07 2015: He complains of constant bilateral lower extremity achy pain, he has stopped daily ibuprofen use, Celebrex 100 mg twice a day does not help, He is also taking gabapentin 1200 mg 3 times a day, Trileptal 150 mg twice a day has helpful, he is also taking baclofen 10 mg 2 tablets 3 times a day, wife reported he tends to drink alcohol before he goes to bed,   He uses CPAP machine at night, which has him sleep better.  Update May 18 2015: His balance is gradaully getting worse, he also complains of pain at the bottom of his feet, like walking on pebbles, he woke up frequently at night time, occasionally nocturnal urinary incontinence, daytime urinary frequency, hesitation,  He is now taking gabaentin up to 3600 mg daily, Oxtellar 19m iii bid and baclofen 346mtid.   UPDATE June 27th 2017: He has a lot of right hip pain, low back pain, bone  pain, hurt after he walks for a while, back pain when he bending over, he can only walk 15-20 minutes, he is not active, he has not swim for 2 years,  Bilateral feet feel like in fire, burning and cold sometimes.  He is now taking gabaentin up to 3600 mg daily, Oxtellar 142m iii bid and baclofen 370mtid.   UPDATE Jan 4th 2018: He was seen by orthopedics recently for right hip pain,  I personally reviewed x-ray on May 11 2016, there was no significant abnormality, he still has neuropathic pain at bilateral feet, he is taking gabapentin 180051mid, oxtellar 150m78mi bid, baclofen 30mg46m,  He sleeps a lot during the day, he has irregular night time sleep schedule.  He reported low sodium recent laboratory evaluations.   REVIEW OF SYSTEMS: Full 14 system review of systems performed and notable only for eye redness, light sensitivity, runny nose, cold intolerance, heat intolerance, excessive eating, acne, frequent waking, daytime sleepiness, snoring, difficulty urinating, incontinence of bladder, joint pain, joint swelling, back pain, achy muscles, walking difficulty, memory loss, numbness, weakness, tremor, agitation, confusion, decreased concentration, anxiety  ALLERGIES: No Known Allergies  HOME MEDICATIONS: Current Outpatient Prescriptions  Medication Sig Dispense Refill  . aspirin 81 MG tablet Take 81 mg by mouth daily.    . ateMarland Kitchenolol (TENORMIN) 50 MG tablet Take 50 mg by mouth daily.  11  . baclofen (LIORESAL) 10 MG tablet TAKE 2 TABLETS (20 MG TOTAL) BY MOUTH 3 (THREE) TIMES DAILY. 180 tablet 11  . gabapentin (NEURONTIN) 600 MG tablet TAKE 2 TABLETS BY MOUTH THREE TIMES DAILY 180 tablet 11  . gemfibrozil (LOPID) 600 MG tablet     . glimepiride (AMARYL) 4 MG tablet TAKE 1 TAB BY MOUTH ONCE DAILY WITH BREAKFAST OR FIRST MAIN MEAL OF THE DAY  6  . HYDROcodone-acetaminophen (NORCO/VICODIN) 5-325 MG per tablet   0  . losartan (COZAAR) 100 MG tablet     . metFORMIN (GLUCOPHAGE) 500 MG tablet Take 500 mg by mouth 2 (two) times daily with a meal.    . ONE TOUCH ULTRA TEST test strip     . OXcarbazepine ER (OXTELLAR XR) 150 MG TB24 Take 450 mg by mouth 2 (two) times daily. 540 tablet 1  . UNABLE TO FIND Med Name: Amantadine 8%, Baclofen 2%, Cyclobenzaprine 2%, Diclofenac 3%, Gabapentin 6%, Bupivacaine 2%.     No current facility-administered medications for this visit.       PAST MEDICAL HISTORY: Past Medical History:  Diagnosis Date  . Diabetes (HCC) [place] High cholesterol   . Neuropathic pain of lower extremity     PAST SURGICAL HISTORY: Past Surgical History:  Procedure Laterality Date  . Right foot/ ankle reconstruction      FAMILY HISTORY: Family History  Problem Relation Age of Onset  . Cancer Mother   . Cancer Father     SOCIAL HISTORY:  Social History   Social History  . Marital status: Married    Spouse name: DonnaButch Pennyumber of children: 2  . Years of education: 12   Occupational History  .  [employer] Main Topics  . Smoking status: Current Every Day Smoker    Packs/day: 0.25  . Smokeless tobacco: Never Used  . Alcohol use 0.0 oz/week     Comment: fifth per week  . Drug use: No  . Sexual activity: Not on file   Other Topics Concern  .  Not on file   Social History Narrative   Patient is married and lives at home with wife.    Patient has a Copywriter, advertising.   Patient drinks 6-8 cups of caffeine daily.    Right-handed.           PHYSICAL EXAM   Vitals:   05/25/16 0824  BP: 119/69  Pulse: 66  Weight: 292 lb (132.5 kg)  Height: 6' 4" (1.93 m)    Not recorded      Body mass index is 35.54 kg/m.  PHYSICAL EXAMNIATION:  Gen: NAD, conversant, well nourised, obese, well groomed                     Cardiovascular: Regular rate rhythm, no peripheral edema, warm, nontender. Eyes: Conjunctivae clear without exudates or hemorrhage Neck: Supple, no carotid bruise. Pulmonary: Clear to auscultation bilaterally   NEUROLOGICAL EXAM:  MENTAL STATUS: Speech:    Speech is normal; fluent and spontaneous with normal comprehension.  Cognition:    The patient is oriented to person, place, and time;     recent and remote memory intact;     language fluent;     normal attention, concentration,     fund of knowledge.  CRANIAL NERVES: CN II: Visual fields are full to  confrontation. Fundoscopic exam is normal with sharp discs and no vascular changes. Venous pulsations are present bilaterally. Pupils are 4 mm and briskly reactive to light. Visual acuity is 20/20 bilaterally. CN III, IV, VI: extraocular movement are normal. No ptosis. CN V: Facial sensation is intact to pinprick in all 3 divisions bilaterally. Corneal responses are intact.  CN VII: Face is symmetric with normal eye closure and smile. CN VIII: Hearing is normal to rubbing fingers CN IX, X: Palate elevates symmetrically. Phonation is normal. CN XI: Head turning and shoulder shrug are intact CN XII: Tongue is midline with normal movements and no atrophy.  MOTOR: There is no pronator drift of out-stretched arms. Muscle bulk and tone are normal. Muscle strength is normal at bilateral upper extremities  Mild to moderate spasticity of bilateral lower extremity, no significant weakness  REFLEXES: Reflexes are 2+ and symmetric at the biceps, triceps, 3 at knees, and ankles. Plantar responses are extensor bilaterally   Sensory: Length dependent decreased Light touch, pinprick to distal legs decreased vibration sense  at toes COORDINATION: Rapid alternating movements and fine finger movements are intact. limited at heel-to-shin due to spasticity   GAIT/STANCE: He needs to push up to get up from seated position, wide-based, cautious, stiff gait.    DIAGNOSTIC DATA (LABS, IMAGING, TESTING) - I reviewed patient records, labs, notes, testing and imaging myself where available.  Lab Results  Component Value Date   WBC 8.4 08/23/2010   HGB 15.8 08/23/2010   HCT 44.1 08/23/2010   MCV 90.0 08/23/2010   PLT 193 08/23/2010      Component Value Date/Time   NA 137 08/23/2010 1417   K 4.0 SLIGHT HEMOLYSIS 08/23/2010 1417   CL 104 08/23/2010 1417   CO2 24 08/23/2010 1417   GLUCOSE 109 (H) 08/23/2010 1417   BUN 13 08/23/2010 1417   CREATININE 0.81 08/23/2010 1417   CALCIUM 9.8 08/23/2010 1417    GFRNONAA >60 08/23/2010 1417   GFRAA  08/23/2010 1417    >60        The eGFR has been calculated using the MDRD equation. This calculation has not been validated in all clinical situations. eGFR's persistently <60 mL/min  signify possible Chronic Kidney Disease.    ASSESSMENT AND PLAN  LASON EVELAND is a 66 y.o. 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 Overall stable spastic paraplegia I have advised him continue moderate exercise, gait training  Bilateral lower extremity neuropathic pain   1, keep gabapentin 3600 mg daily   2, baclofen 10 mg tablet , 90 mg daily   3.increase oxtell to 450 mg twice a day   Watch sodium level Right hip pain, low back pain, most likely musculoskeletal etiology    Is followed up by orthopedic surgeon  Marcial Pacas, M.D. Ph.D.  [facility] [address] [place], [place] [address] Ph: [phone] Fax: [phone]

## 2016-05-29 ENCOUNTER — Ambulatory Visit (INDEPENDENT_AMBULATORY_CARE_PROVIDER_SITE_OTHER): Payer: BLUE CROSS/BLUE SHIELD | Admitting: Physical Medicine and Rehabilitation

## 2016-05-29 ENCOUNTER — Encounter (INDEPENDENT_AMBULATORY_CARE_PROVIDER_SITE_OTHER): Payer: Self-pay | Admitting: Physical Medicine and Rehabilitation

## 2016-05-29 VITALS — BP 148/84

## 2016-05-29 DIAGNOSIS — M25551 Pain in right hip: Secondary | ICD-10-CM

## 2016-05-29 MED ORDER — TRIAMCINOLONE ACETONIDE 40 MG/ML IJ SUSP
80.0000 mg | INTRAMUSCULAR | Status: AC | PRN
  Administered 2016-05-29: 80 mg via INTRA_ARTICULAR

## 2016-05-29 MED ORDER — LIDOCAINE HCL 2 % IJ SOLN
4.0000 mL | INTRAMUSCULAR | Status: AC | PRN
  Administered 2016-05-29: 4 mL

## 2016-05-29 NOTE — Progress Notes (Signed)
Kenneth KohutLarry F Thompson - 66 y.o. male MRN [id number]  Date of birth: 01/09/1951  Office Visit Note: Visit Date: 05/29/2016 PCP: Astrid DivineGRIFFIN,ELAINE COLLINS, MD Referred by: Maurice SmallGriffin, Elaine, MD  Subjective: Chief Complaint  Patient presents with  . Right Hip - Pain   HPI: [name] is a 66 year old gentleman with complaints of  Right hip pain for one month. Comes and goes. "Some days not a lot of pain at all and then other days extreme pain." Notices if he sleeps with heating pad at night then not a lot of pain the next day. Hurts worse with sitting long periods. Denies groin pain Pain is really posterior lateral and deep. He does not note any specific movement that can reproduce the pain. He does use a cane for ambulation. His history is complicated by prior arteriovenous malformation of the thoracic spine. His history is well documented throughout Epic.    ROS Otherwise per HPI.  Assessment & Plan: Visit Diagnoses:  1. Pain in right hip     Plan: Findings:  Right anesthetic hip arthrogram. He seemed to get a little bit of relief initially with sitting down and standing up. He states that he'll have to get his truck and drive for a while to see a big difference. He is going to talk to Dr. Roda ShuttersXu in the next couple weeks depending on how to us.    Meds & Orders: No orders of the defined types were placed in this encounter.   Orders Placed This Encounter  Procedures  . Large Joint Injection/Arthrocentesis    Follow-up: Return if symptoms worsen or fail to improve, after 2 weeks.   Procedures: Sitting hip arthrogram Date/Time: 05/29/2016 8:52 AM Performed by: Tyrell AntonioNEWTON, Mckale Haffey Authorized by: Tyrell AntonioNEWTON, Kiyla Ringler   Consent Given by:  Patient Site marked: the procedure site was marked   Timeout: prior to procedure the correct patient, procedure, and site was verified   Indications:  Pain and diagnostic evaluation Location:  Hip Site:  R hip joint Prep: patient was prepped and draped in usual  sterile fashion   Needle Size:  22 G Needle Length:  3.5 inches Approach:  Anterior Ultrasound Guidance: No   Fluoroscopic Guidance: No   Arthrogram: Yes   Medications:  4 mL lidocaine 2 %; 80 mg triamcinolone acetonide 40 MG/ML Aspiration Attempted: Yes   Patient tolerance:  Patient tolerated the procedure well with no immediate complications  Arthrogram demonstrated excellent flow of contrast throughout the joint surface without extravasation or obvious defect.  The patient had relief of symptoms during the anesthetic phase of the injection.     No notes on file   Clinical History: No specialty comments available.  He reports that he has been smoking.  He has been smoking about 0.25 packs per day. He has never used smokeless tobacco. No results for input(s): HGBA1C, LABURIC in the last 8760 hours.  Objective:  VS:  HT:    WT:   BMI:     BP:(!) 148/84  HR: bpm  TEMP: ( )  RESP:  Physical Exam  Ortho Exam Imaging: No results found.  Past Medical/Family/Surgical/Social History: Medications & Allergies reviewed per EMR Patient Active Problem List   Diagnosis Date Noted  . 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 Disturbance of skin sensation 01/15/2013  . Abnormality of gait 01/15/2013   Past Medical History:  Diagnosis Date  . Diabetes (HCC)   . High cholesterol   . Neuropathic pain of lower  extremity    Family History  Problem Relation Age of Onset  . Cancer Mother   . Cancer Father    Past Surgical History:  Procedure Laterality Date  . Right foot/ ankle reconstruction     Social History   Occupational History  .  [employer]   Social History Main Topics  . Smoking status: Current Every Day Smoker    Packs/day: 0.25  . Smokeless tobacco: Never Used  . Alcohol use 0.0 oz/week     Comment: fifth per week  . Drug use: No  . Sexual activity: Not on file

## 2016-05-29 NOTE — Patient Instructions (Signed)

## 2016-07-12 ENCOUNTER — Other Ambulatory Visit: Payer: Self-pay | Admitting: Neurology

## 2016-07-17 ENCOUNTER — Other Ambulatory Visit: Payer: Self-pay | Admitting: Neurology

## 2016-08-01 ENCOUNTER — Ambulatory Visit: Payer: BLUE CROSS/BLUE SHIELD | Admitting: Neurology

## 2016-08-08 ENCOUNTER — Ambulatory Visit (INDEPENDENT_AMBULATORY_CARE_PROVIDER_SITE_OTHER): Payer: Self-pay

## 2016-08-08 ENCOUNTER — Ambulatory Visit (INDEPENDENT_AMBULATORY_CARE_PROVIDER_SITE_OTHER): Payer: BLUE CROSS/BLUE SHIELD | Admitting: Orthopaedic Surgery

## 2016-08-08 DIAGNOSIS — M25551 Pain in right hip: Secondary | ICD-10-CM | POA: Diagnosis not present

## 2016-08-08 MED ORDER — BUPIVACAINE HCL 0.5 % IJ SOLN
3.0000 mL | INTRAMUSCULAR | Status: AC | PRN
  Administered 2016-08-08: 3 mL via INTRA_ARTICULAR

## 2016-08-08 MED ORDER — TRIAMCINOLONE ACETONIDE 40 MG/ML IJ SUSP
80.0000 mg | INTRAMUSCULAR | Status: AC | PRN
  Administered 2016-08-08: 80 mg via INTRA_ARTICULAR

## 2016-08-08 NOTE — Progress Notes (Signed)
Kenneth KohutLarry F Thompson - 66 y.o. male MRN [id number]  Date of birth: 01/08/1951  Office Visit Note: Visit Date: 08/08/2016 PCP: Astrid DivineGRIFFIN,ELAINE COLLINS, MD Referred by: Maurice SmallGriffin, Elaine, MD  Subjective: Chief Complaint  Patient presents with  . Right Hip - Pain   HPI: [name] is a 66 year old gentleman with chronic worsening right hip pain. Prior hip injection gave him decent relief of his symptoms. He saw Dr. Roda ShuttersXu today and Dr. Roda ShuttersXu requesting an injection.    ROS Otherwise per HPI.  Assessment & Plan: Visit Diagnoses:  1. Pain in right hip     Plan: Findings:  Right hip intra-articular injection fluoroscopic guidance.    Meds & Orders: No orders of the defined types were placed in this encounter.   Orders Placed This Encounter  Procedures  . Large Joint Injection/Arthrocentesis  . MR Hip Right w/o contrast  . XR C-ARM NO REPORT    Follow-up: Return in about 2 weeks (around 08/22/2016).   Procedures: Large Joint Inj Date/Time: 08/08/2016 8:35 AM Performed by: Tyrell AntonioNEWTON, Yomar Mejorado Authorized by: Tyrell AntonioNEWTON, Jaley Yan   Consent Given by:  Patient Site marked: the procedure site was marked   Timeout: prior to procedure the correct patient, procedure, and site was verified   Indications:  Pain and diagnostic evaluation Location:  Hip Site:  R hip joint Prep: patient was prepped and draped in usual sterile fashion   Needle Size:  22 G Needle Length:  3.5 inches Approach:  Anterior Ultrasound Guidance: No   Fluoroscopic Guidance: Yes   Arthrogram: No   Medications:  80 mg triamcinolone acetonide 40 MG/ML; 3 mL bupivacaine 0.5 % Aspiration Attempted: Yes   Patient tolerance:  Patient tolerated the procedure well with no immediate complications  There was excellent flow of contrast producing a partial arthrogram of the hip. The patient did have relief of symptoms during the anesthetic phase of the injection.    No notes on file   Clinical History: No specialty comments available.    He reports that he has been smoking.  He has been smoking about 0.25 packs per day. He has never used smokeless tobacco. No results for input(s): HGBA1C, LABURIC in the last 8760 hours.  Objective:  VS:  HT:    WT:   BMI:     BP:   HR: bpm  TEMP: ( )  RESP:  Physical Exam  Musculoskeletal:  Patient ambulates without aid with good distal strength.    Ortho Exam Imaging: No results found.  Past Medical/Family/Surgical/Social History: Medications & Allergies reviewed per EMR Patient Active Problem List   Diagnosis Date Noted  . 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 Disturbance of skin sensation 01/15/2013  . Abnormality of gait 01/15/2013   Past Medical History:  Diagnosis Date  . Diabetes (HCC)   . High cholesterol   . Neuropathic pain of lower extremity    Family History  Problem Relation Age of Onset  . Cancer Mother   . Cancer Father    Past Surgical History:  Procedure Laterality Date  . Right foot/ ankle reconstruction     Social History   Occupational History  .  [employer]   Social History Main Topics  . Smoking status: Current Every Day Smoker    Packs/day: 0.25  . Smokeless tobacco: Never Used  . Alcohol use 0.0 oz/week     Comment: fifth per week  . Drug use: No  . Sexual activity:  Not on file

## 2016-08-08 NOTE — Progress Notes (Signed)
   Office Visit Note   Patient: Kenneth Thompson           Date of Birth: 02/24/1951           MRN: [id number] Visit Date: 08/08/2016              Requested by: Maurice SmallElaine Griffin, MD [address] [place], [place] [address] PCP: Astrid DivineGRIFFIN,ELAINE COLLINS, MD   Assessment & Plan: Visit Diagnoses:  1. Pain in right hip     Plan: We'll place an order for another right hip injection with Dr. Alvester MorinNewton. I would like to get a MRI of the right hip to evaluate for structural abnormality and severity of degenerative joint disease. Follow-up after the MRI.  Follow-Up Instructions: Return in about 2 weeks (around 08/22/2016).   Orders:  No orders of the defined types were placed in this encounter.  No orders of the defined types were placed in this encounter.     Procedures: No procedures performed   Clinical Data: No additional findings.   Subjective: Chief Complaint  Patient presents with  . Right Hip - Pain    She comes back today for right hip pain requesting another injection. He had intra-articular steroid injection with Dr. Alvester MorinNewton back in December and had great relief until couple days ago. He is requesting another one.    Review of Systems   Objective: Vital Signs: There were no vitals taken for this visit.  Physical Exam  Ortho Exam Right hip exam is stable. Specialty Comments:  No specialty comments available.  Imaging: No results found.   PMFS History: Patient Active Problem List   Diagnosis Date Noted  . 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 Disturbance of skin sensation 01/15/2013  . Abnormality of gait 01/15/2013   Past Medical History:  Diagnosis Date  . Diabetes (HCC)   . High cholesterol   . Neuropathic pain of lower extremity     Family History  Problem Relation Age of Onset  . Cancer Mother   . Cancer Father     Past Surgical History:  Procedure Laterality Date  . Right foot/ ankle  reconstruction     Social History   Occupational History  .  [employer]   Social History Main Topics  . Smoking status: Current Every Day Smoker    Packs/day: 0.25  . Smokeless tobacco: Never Used  . Alcohol use 0.0 oz/week     Comment: fifth per week  . Drug use: No  . Sexual activity: Not on file

## 2016-08-16 ENCOUNTER — Ambulatory Visit
Admission: RE | Admit: 2016-08-16 | Discharge: 2016-08-16 | Disposition: A | Discharge status: Home / Self Care | Payer: BLUE CROSS/BLUE SHIELD | Source: Ambulatory Visit | Attending: Orthopaedic Surgery | Admitting: Orthopaedic Surgery

## 2016-08-16 DIAGNOSIS — M25551 Pain in right hip: Secondary | ICD-10-CM

## 2016-08-29 ENCOUNTER — Ambulatory Visit (INDEPENDENT_AMBULATORY_CARE_PROVIDER_SITE_OTHER): Payer: BLUE CROSS/BLUE SHIELD | Admitting: Orthopaedic Surgery

## 2016-08-29 ENCOUNTER — Encounter (INDEPENDENT_AMBULATORY_CARE_PROVIDER_SITE_OTHER): Payer: Self-pay | Admitting: Orthopaedic Surgery

## 2016-08-29 DIAGNOSIS — M25551 Pain in right hip: Secondary | ICD-10-CM | POA: Diagnosis not present

## 2016-08-29 NOTE — Addendum Note (Signed)
Addended by: Albertina Parr on: 08/29/2016 08:44 AM   Modules accepted: Orders

## 2016-08-29 NOTE — Progress Notes (Signed)
   Office Visit Note   Patient: Kenneth Thompson           Date of Birth: 1950/11/09           MRN: [id number] Visit Date: 08/29/2016              Requested by: [name], MD [address] [place], [place] [address] PCP: Astrid Divine, MD   Assessment & Plan: Visit Diagnoses:  1. Pain in right hip     Plan: At this point like to get an MRI of his lumbar spine.  Previous surgery there back in 2012 for fistula at [facility]. His MRI of his hip is essentially normal and cannot explain why he has hip pain. I'll see him back after the MRI  Follow-Up Instructions: Return in about 10 days (around 09/08/2016).   Orders:  No orders of the defined types were placed in this encounter.  No orders of the defined types were placed in this encounter.     Procedures: No procedures performed   Clinical Data: No additional findings.   Subjective: Chief Complaint  Patient presents with  . Right Hip - Pain    Patient comes back today to review his MRI of his right hip. He did not get relief from the most recent injection in the right hip. He denies any groin pain.    Review of Systems   Objective: Vital Signs: There were no vitals taken for this visit.  Physical Exam  Ortho Exam Right hip exam shows no reproducible pain with palpation throughout the hip. He has painless range of motion the hip. Specialty Comments:  No specialty comments available.  Imaging: No results found.   PMFS History: Patient Active Problem List   Diagnosis Date Noted  . 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 Disturbance of skin sensation 01/15/2013  . Abnormality of gait 01/15/2013   Past Medical History:  Diagnosis Date  . Diabetes (HCC)   . High cholesterol   . Neuropathic pain of lower extremity     Family History  Problem Relation Age of Onset  . Cancer Mother   . Cancer Father     Past Surgical History:  Procedure  Laterality Date  . Right foot/ ankle reconstruction     Social History   Occupational History  .  [employer]   Social History Main Topics  . Smoking status: Current Every Day Smoker    Packs/day: 0.25  . Smokeless tobacco: Never Used  . Alcohol use 0.0 oz/week     Comment: fifth per week  . Drug use: No  . Sexual activity: Not on file

## 2016-08-30 ENCOUNTER — Telehealth: Payer: Self-pay | Admitting: Neurology

## 2016-08-30 ENCOUNTER — Other Ambulatory Visit: Payer: Self-pay | Admitting: *Deleted

## 2016-08-30 MED ORDER — GABAPENTIN 600 MG PO TABS: 1200.0000 mg | ORAL_TABLET | Freq: Four times a day (QID) | ORAL | 11 refills | Status: DC

## 2016-08-30 NOTE — Telephone Encounter (Signed)
Pt says he has about 6 more days worth left of the gabapentin (NEURONTIN) 600 MG tablet  He would like to know if an increase in dosage of this can be sent in because he really needs it and can not wait until the refill date of 09-16-2016

## 2016-08-30 NOTE — Telephone Encounter (Addendum)
Spoke to patient - feels gabapentin , TID is no longer controlling his pain.  Per vo by Dr. Terrace Arabia, she will provide an increase in the gabapentin dosage to , QID.  If this is not helpful, then she has asked him to make an earlier follow up appt.  He is agreeable to this plan and verbalized understanding.

## 2016-09-07 ENCOUNTER — Ambulatory Visit
Admission: RE | Admit: 2016-09-07 | Discharge: 2016-09-07 | Disposition: A | Discharge status: Home / Self Care | Payer: BLUE CROSS/BLUE SHIELD | Source: Ambulatory Visit | Attending: Orthopaedic Surgery | Admitting: Orthopaedic Surgery

## 2016-09-07 DIAGNOSIS — M25551 Pain in right hip: Secondary | ICD-10-CM

## 2016-09-11 ENCOUNTER — Encounter (INDEPENDENT_AMBULATORY_CARE_PROVIDER_SITE_OTHER): Payer: Self-pay | Admitting: Orthopaedic Surgery

## 2016-09-11 ENCOUNTER — Ambulatory Visit (INDEPENDENT_AMBULATORY_CARE_PROVIDER_SITE_OTHER): Payer: BLUE CROSS/BLUE SHIELD | Admitting: Orthopaedic Surgery

## 2016-09-11 DIAGNOSIS — M48062 Spinal stenosis, lumbar region with neurogenic claudication: Secondary | ICD-10-CM

## 2016-09-11 NOTE — Progress Notes (Signed)
   Office Visit Note   Patient: Kenneth Thompson           Date of Birth: [birth year]           MRN: [id number] Visit Date: 09/11/2016              Requested by: [name], MD [address] [place], [place] [address] PCP: Astrid Divine, MD   Assessment & Plan: Visit Diagnoses:  1. Spinal stenosis of lumbar region with neurogenic claudication     Plan: MRI of his lumbar spine shows severe spinal stenosis worst at L4-L5 with significant facet degenerative changes and hypertrophy and ligamentum hypertrophy. He has anterolisthesis. Urgent referral to neurosurgery for surgical evaluation  Follow-Up Instructions: Return if symptoms worsen or fail to improve.   Orders:  No orders of the defined types were placed in this encounter.  No orders of the defined types were placed in this encounter.     Procedures: No procedures performed   Clinical Data: No additional findings.   Subjective: Chief Complaint  Patient presents with  . Right Hip - Pain    Patient follows up today to review his MRI. He is having worsening back pain with neurogenic claudication. He had to walk with a walker briefly this weekend.    Review of Systems  Constitutional: Negative.   All other systems reviewed and are negative.    Objective: Vital Signs: There were no vitals taken for this visit.  Physical Exam  Constitutional: He is oriented to person, place, and time. He appears well-developed and well-nourished.  Pulmonary/Chest: Effort normal.  Abdominal: Soft.  Neurological: He is alert and oriented to person, place, and time.  Skin: Skin is warm.  Psychiatric: He has a normal mood and affect. His behavior is normal. Judgment and thought content normal.  Nursing note and vitals reviewed.   Ortho Exam Exam is stable. Specialty Comments:  No specialty comments available.  Imaging: No results found.   PMFS History: Patient Active Problem List   Diagnosis  Date Noted  . 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 Disturbance of skin sensation 01/15/2013  . Abnormality of gait 01/15/2013   Past Medical History:  Diagnosis Date  . Diabetes (HCC)   . High cholesterol   . Neuropathic pain of lower extremity     Family History  Problem Relation Age of Onset  . Cancer Mother   . Cancer Father     Past Surgical History:  Procedure Laterality Date  . Right foot/ ankle reconstruction     Social History   Occupational History  .  [employer]   Social History Main Topics  . Smoking status: Current Every Day Smoker    Packs/day: 0.25  . Smokeless tobacco: Never Used  . Alcohol use 0.0 oz/week     Comment: fifth per week  . Drug use: No  . Sexual activity: Not on file

## 2016-09-11 NOTE — Addendum Note (Signed)
Addended by: Albertina Parr on: 09/11/2016 01:08 PM   Modules accepted: Orders

## 2016-09-13 ENCOUNTER — Other Ambulatory Visit (HOSPITAL_COMMUNITY): Payer: Self-pay | Admitting: Neurosurgery

## 2016-09-13 DIAGNOSIS — M4316 Spondylolisthesis, lumbar region: Secondary | ICD-10-CM

## 2016-09-18 ENCOUNTER — Encounter (HOSPITAL_COMMUNITY): Payer: Self-pay

## 2016-09-18 ENCOUNTER — Ambulatory Visit (HOSPITAL_COMMUNITY)
Admission: RE | Admit: 2016-09-18 | Discharge: 2016-09-18 | Disposition: A | Discharge status: Home / Self Care | Payer: BLUE CROSS/BLUE SHIELD | Source: Ambulatory Visit | Attending: Neurosurgery | Admitting: Neurosurgery

## 2016-09-18 DIAGNOSIS — M4316 Spondylolisthesis, lumbar region: Secondary | ICD-10-CM | POA: Diagnosis present

## 2016-09-18 DIAGNOSIS — M47816 Spondylosis without myelopathy or radiculopathy, lumbar region: Secondary | ICD-10-CM | POA: Insufficient documentation

## 2016-09-18 DIAGNOSIS — M48061 Spinal stenosis, lumbar region without neurogenic claudication: Secondary | ICD-10-CM | POA: Insufficient documentation

## 2016-09-18 DIAGNOSIS — I700 Atherosclerosis of aorta: Secondary | ICD-10-CM | POA: Diagnosis not present

## 2016-09-19 ENCOUNTER — Other Ambulatory Visit: Payer: Self-pay | Admitting: Neurosurgery

## 2016-09-27 NOTE — Pre-Procedure Instructions (Signed)
Kenneth KohutLarry F Thompson  09/27/2016      [facility] [address] [place] [facility] [facility] [place], [place] - [id number] [place] [id number] Phone: [phone] Fax: [phone]    Your procedure is scheduled on May 18 at 130 PM..  [facility] at [facility] AM.  Call this number if you have problems the morning of surgery:  [phone]   Remember:  Do not eat food or drink liquids after midnight.  Take these medicines the morning of surgery with A SIP OF WATER atenolol (tenormin), baclofen (lioresal), gabapentin (neurontin), gemfibrozil (lopid), oxcarbazepine (oxtellar), hydrocodone (norco)-if needed or oxycodone (percocet)-if needed for pain  7 days prior to surgery STOP taking any Aspirin, Aleve, Naproxen, Ibuprofen, Motrin, Advil, Goody's, BC's, all herbal medications, fish oil, and all vitamins   Do not wear jewelry, make-up or nail polish.  Do not wear lotions, powders, or perfumes, or deoderant.  Do not shave 48 hours prior to surgery.  Men may shave face and neck.  Do not bring valuables to the hospital.  [facility] is not responsible for any belongings or valuables.  Contacts, dentures or bridgework may not be worn into surgery.  Leave your suitcase in the car.  After surgery it may be brought to your room.  For patients admitted to the hospital, discharge time will be determined by your treatment team.  Patients discharged the day of surgery will not be allowed to drive home.    Special instructions:   [facility]- Preparing For Surgery  Before surgery, you can play an important role. Because skin is not sterile, your skin needs to be as free of germs as possible. You can reduce the number of germs on your skin by washing with CHG (chlorahexidine gluconate) Soap before surgery.  CHG is an antiseptic cleaner which kills germs and bonds with the skin to continue killing germs even after washing.  Please do not use if you have an allergy to CHG or  antibacterial soaps. If your skin becomes reddened/irritated stop using the CHG.  Do not shave (including legs and underarms) for at least 48 hours prior to first CHG shower. It is OK to shave your face.  Please follow these instructions carefully.   1. Shower the NIGHT BEFORE SURGERY and the MORNING OF SURGERY with CHG.   2. If you chose to wash your hair, wash your hair first as usual with your normal shampoo.  3. After you shampoo, rinse your hair and body thoroughly to remove the shampoo.  4. Use CHG as you would any other liquid soap. You can apply CHG directly to the skin and wash gently with a scrungie or a clean washcloth.   5. Apply the CHG Soap to your body ONLY FROM THE NECK DOWN.  Do not use on open wounds or open sores. Avoid contact with your eyes, ears, mouth and genitals (private parts). Wash genitals (private parts) with your normal soap.  6. Wash thoroughly, paying special attention to the area where your surgery will be performed.  7. Thoroughly rinse your body with warm water from the neck down.  8. DO NOT shower/wash with your normal soap after using and rinsing off the CHG Soap.  9. Pat yourself dry with a CLEAN TOWEL.   10. Wear CLEAN PAJAMAS   11. Place CLEAN SHEETS on your bed the night of your first shower and DO NOT SLEEP WITH PETS.     How to Manage Your  Diabetes Before and After Surgery  Why is it important to control my blood sugar before and after surgery? . Improving blood sugar levels before and after surgery helps healing and can limit problems. . A way of improving blood sugar control is eating a healthy diet by: o  Eating less sugar and carbohydrates o  Increasing activity/exercise o  Talking with your doctor about reaching your blood sugar goals . High blood sugars (greater than 180 mg/dL) can raise your risk of infections and slow your recovery, so you will need to focus on controlling your diabetes during the weeks before surgery. . Make  sure that the doctor who takes care of your diabetes knows about your planned surgery including the date and location.  How do I manage my blood sugar before surgery? . Check your blood sugar at least 4 times a day, starting 2 days before surgery, to make sure that the level is not too high or low. o Check your blood sugar the morning of your surgery when you wake up and every 2 hours until you get to the Short Stay unit. . If your blood sugar is less than 70 mg/dL, you will need to treat for low blood sugar: o Do not take insulin. o Treat a low blood sugar (less than 70 mg/dL) with  cup of clear juice (cranberry or apple), 4 glucose tablets, OR glucose gel. o Recheck blood sugar in 15 minutes after treatment (to make sure it is greater than 70 mg/dL). If your blood sugar is not greater than 70 mg/dL on recheck, call [phone] for further instructions. . Report your blood sugar to the short stay nurse when you get to Short Stay.  . If you are admitted to the hospital after surgery: o Your blood sugar will be checked by the staff and you will probably be given insulin after surgery (instead of oral diabetes medicines) to make sure you have good blood sugar levels. o The goal for blood sugar control after surgery is 80-180 mg/dL         WHAT DO I DO ABOUT MY DIABETES MEDICATION?   Marland Kitchen Do not take oral diabetes medicines (pills) the morning of surgery.  . The day of surgery, do not take other diabetes injectables, including Byetta (exenatide), Bydureon (exenatide ER), Victoza (liraglutide), or Trulicity (dulaglutide).  . If your CBG is greater than 220 mg/dL, you may take  of your sliding scale (correction) dose of insulin.  Reviewed and Endorsed by [facility] Patient Education Committee, August 2015  Day of Surgery: Do not apply any deodorants/lotions. Please wear clean clothes to the hospital/surgery center.     Please read over the following fact sheets that you were  given. Pain Booklet, Coughing and Deep Breathing, MRSA Information and Surgical Site Infection Prevention

## 2016-09-28 ENCOUNTER — Encounter (HOSPITAL_COMMUNITY)
Admission: RE | Admit: 2016-09-28 | Discharge: 2016-09-28 | Disposition: A | Discharge status: Home / Self Care | Payer: Commercial Managed Care - PPO | Source: Ambulatory Visit | Attending: Neurosurgery | Admitting: Neurosurgery

## 2016-09-28 ENCOUNTER — Encounter (HOSPITAL_COMMUNITY): Payer: Self-pay

## 2016-09-28 DIAGNOSIS — M4316 Spondylolisthesis, lumbar region: Secondary | ICD-10-CM | POA: Diagnosis not present

## 2016-09-28 DIAGNOSIS — Z01810 Encounter for preprocedural cardiovascular examination: Secondary | ICD-10-CM | POA: Diagnosis present

## 2016-09-28 DIAGNOSIS — Z01812 Encounter for preprocedural laboratory examination: Secondary | ICD-10-CM | POA: Diagnosis present

## 2016-09-28 DIAGNOSIS — R001 Bradycardia, unspecified: Secondary | ICD-10-CM | POA: Diagnosis not present

## 2016-09-28 HISTORY — DX: Unspecified osteoarthritis, unspecified site: M19.90

## 2016-09-28 HISTORY — DX: Anxiety disorder, unspecified: F41.9

## 2016-09-28 HISTORY — DX: Sleep apnea, unspecified: G47.30

## 2016-09-28 LAB — BASIC METABOLIC PANEL
ANION GAP: 11 (ref 5–15)
BUN: 17 mg/dL (ref 6–20)
CO2: 18 mmol/L — ABNORMAL LOW (ref 22–32)
Calcium: 9.7 mg/dL (ref 8.9–10.3)
Chloride: 105 mmol/L (ref 101–111)
Creatinine, Ser: 0.89 mg/dL (ref 0.61–1.24)
GFR calc Af Amer: >60 mL/min (ref >60)
Glucose, Bld: 92 mg/dL (ref 65–99)
POTASSIUM: 5.1 mmol/L (ref 3.5–5.1)
SODIUM: 134 mmol/L — AB (ref 135–145)

## 2016-09-28 LAB — TYPE AND SCREEN
ABO/RH(D): O POS
Antibody Screen: NEGATIVE

## 2016-09-28 LAB — CBC
HCT: 45.9 % (ref 39.0–52.0)
Hemoglobin: 16 g/dL (ref 13.0–17.0)
MCH: 31.9 pg (ref 26.0–34.0)
MCHC: 34.9 g/dL (ref 30.0–36.0)
MCV: 91.6 fL (ref 78.0–100.0)
PLATELETS: 180 10*3/uL (ref 150–400)
RBC: 5.01 MIL/uL (ref 4.22–5.81)
RDW: 13.1 % (ref 11.5–15.5)
WBC: 12.6 10*3/uL — AB (ref 4.0–10.5)

## 2016-09-28 LAB — GLUCOSE, CAPILLARY
GLUCOSE-CAPILLARY: 55 mg/dL — AB (ref 65–99)
GLUCOSE-CAPILLARY: 94 mg/dL (ref 65–99)

## 2016-09-28 LAB — ABO/RH: ABO/RH(D): O POS

## 2016-09-28 LAB — SURGICAL PCR SCREEN
MRSA, PCR: NEGATIVE
STAPHYLOCOCCUS AUREUS: NEGATIVE

## 2016-09-28 MED ORDER — CEFAZOLIN SODIUM-DEXTROSE 2-4 GM/100ML-% IV SOLN: 2.0000 g | INTRAVENOUS | Status: DC

## 2016-09-28 NOTE — Progress Notes (Addendum)
Pt. Arrived for pre-op visit CBG 55.  Pt given graham crackers and orange juice.   PCP Dr. Maurice SmallElaine Griffin   CBG re-checked after pre-admit appointment   92  Pt. Encouraged to go eat breakfast

## 2016-09-29 LAB — HEMOGLOBIN A1C
HEMOGLOBIN A1C: 5.6 % (ref 4.8–5.6)
MEAN PLASMA GLUCOSE: 114 mg/dL

## 2016-10-06 ENCOUNTER — Inpatient Hospital Stay (HOSPITAL_COMMUNITY): Payer: Commercial Managed Care - PPO

## 2016-10-06 ENCOUNTER — Encounter (HOSPITAL_COMMUNITY): Payer: Self-pay | Admitting: *Deleted

## 2016-10-06 ENCOUNTER — Encounter (HOSPITAL_COMMUNITY)
Admission: RE | Disposition: A | Discharge status: Home / Self Care | Payer: Self-pay | Source: Ambulatory Visit | Attending: Neurosurgery

## 2016-10-06 ENCOUNTER — Inpatient Hospital Stay (HOSPITAL_COMMUNITY)
Admission: RE | Admit: 2016-10-06 | Discharge: 2016-10-07 | DRG: 455 | Disposition: A | Discharge status: Home / Self Care | Payer: Commercial Managed Care - PPO | Source: Ambulatory Visit | Attending: Neurosurgery | Admitting: Neurosurgery

## 2016-10-06 ENCOUNTER — Inpatient Hospital Stay (HOSPITAL_COMMUNITY): Payer: Commercial Managed Care - PPO | Admitting: Certified Registered Nurse Anesthetist

## 2016-10-06 DIAGNOSIS — M545 Low back pain: Secondary | ICD-10-CM | POA: Diagnosis present

## 2016-10-06 DIAGNOSIS — F419 Anxiety disorder, unspecified: Secondary | ICD-10-CM | POA: Diagnosis present

## 2016-10-06 DIAGNOSIS — M1990 Unspecified osteoarthritis, unspecified site: Secondary | ICD-10-CM | POA: Diagnosis present

## 2016-10-06 DIAGNOSIS — M5416 Radiculopathy, lumbar region: Secondary | ICD-10-CM | POA: Diagnosis present

## 2016-10-06 DIAGNOSIS — G4730 Sleep apnea, unspecified: Secondary | ICD-10-CM | POA: Diagnosis present

## 2016-10-06 DIAGNOSIS — G629 Polyneuropathy, unspecified: Secondary | ICD-10-CM | POA: Diagnosis present

## 2016-10-06 DIAGNOSIS — M4317 Spondylolisthesis, lumbosacral region: Secondary | ICD-10-CM | POA: Diagnosis present

## 2016-10-06 DIAGNOSIS — E7800 Pure hypercholesterolemia, unspecified: Secondary | ICD-10-CM | POA: Diagnosis present

## 2016-10-06 DIAGNOSIS — F17210 Nicotine dependence, cigarettes, uncomplicated: Secondary | ICD-10-CM | POA: Diagnosis present

## 2016-10-06 DIAGNOSIS — M48061 Spinal stenosis, lumbar region without neurogenic claudication: Secondary | ICD-10-CM | POA: Diagnosis present

## 2016-10-06 DIAGNOSIS — E119 Type 2 diabetes mellitus without complications: Secondary | ICD-10-CM | POA: Diagnosis present

## 2016-10-06 DIAGNOSIS — M4316 Spondylolisthesis, lumbar region: Principal | ICD-10-CM | POA: Diagnosis present

## 2016-10-06 DIAGNOSIS — Z419 Encounter for procedure for purposes other than remedying health state, unspecified: Secondary | ICD-10-CM

## 2016-10-06 DIAGNOSIS — Z79899 Other long term (current) drug therapy: Secondary | ICD-10-CM | POA: Diagnosis not present

## 2016-10-06 DIAGNOSIS — Z7982 Long term (current) use of aspirin: Secondary | ICD-10-CM

## 2016-10-06 DIAGNOSIS — Z7984 Long term (current) use of oral hypoglycemic drugs: Secondary | ICD-10-CM

## 2016-10-06 HISTORY — PX: APPLICATION OF ROBOTIC ASSISTANCE FOR SPINAL PROCEDURE: SHX6753

## 2016-10-06 LAB — GLUCOSE, CAPILLARY
Glucose-Capillary: 116 mg/dL — ABNORMAL HIGH (ref 65–99)
Glucose-Capillary: 143 mg/dL — ABNORMAL HIGH (ref 65–99)
Glucose-Capillary: 182 mg/dL — ABNORMAL HIGH (ref 65–99)

## 2016-10-06 SURGERY — POSTERIOR LUMBAR FUSION 2 LEVEL
Anesthesia: General | Site: Spine Lumbar

## 2016-10-06 MED ORDER — THROMBIN 20000 UNITS EX SOLR
CUTANEOUS | Status: DC | PRN
  Administered 2016-10-06: 20 mL via TOPICAL

## 2016-10-06 MED ORDER — FENTANYL CITRATE (PF) 100 MCG/2ML IJ SOLN
INTRAMUSCULAR | Status: AC
  Filled 2016-10-06: qty 2

## 2016-10-06 MED ORDER — GABAPENTIN 300 MG PO CAPS
300.0000 mg | ORAL_CAPSULE | Freq: Three times a day (TID) | ORAL | Status: DC
  Administered 2016-10-06 (×2): 300 mg via ORAL
  Administered 2016-10-07: 900 mg via ORAL
  Filled 2016-10-06 (×3): qty 1

## 2016-10-06 MED ORDER — FLEET ENEMA 7-19 GM/118ML RE ENEM: 1.0000 | ENEMA | Freq: Once | RECTAL | Status: DC | PRN

## 2016-10-06 MED ORDER — ONDANSETRON HCL 4 MG/2ML IJ SOLN: 4.0000 mg | Freq: Once | INTRAMUSCULAR | Status: DC | PRN

## 2016-10-06 MED ORDER — THROMBIN 5000 UNITS EX SOLR
CUTANEOUS | Status: AC
  Filled 2016-10-06: qty 5000

## 2016-10-06 MED ORDER — CYCLOBENZAPRINE HCL 10 MG PO TABS
10.0000 mg | ORAL_TABLET | Freq: Three times a day (TID) | ORAL | Status: DC | PRN
  Administered 2016-10-07 (×2): 10 mg via ORAL
  Filled 2016-10-06 (×3): qty 1

## 2016-10-06 MED ORDER — OXYCODONE HCL 5 MG PO TABS
5.0000 mg | ORAL_TABLET | ORAL | Status: DC | PRN
  Administered 2016-10-06 – 2016-10-07 (×4): 10 mg via ORAL
  Filled 2016-10-06 (×4): qty 2

## 2016-10-06 MED ORDER — ACETAMINOPHEN 325 MG PO TABS: 650.0000 mg | ORAL_TABLET | ORAL | Status: DC | PRN

## 2016-10-06 MED ORDER — SENNOSIDES-DOCUSATE SODIUM 8.6-50 MG PO TABS: 1.0000 | ORAL_TABLET | Freq: Every evening | ORAL | Status: DC | PRN

## 2016-10-06 MED ORDER — SODIUM CHLORIDE 0.9% FLUSH
3.0000 mL | Freq: Two times a day (BID) | INTRAVENOUS | Status: DC
  Administered 2016-10-06: 3 mL via INTRAVENOUS

## 2016-10-06 MED ORDER — LACTATED RINGERS IV SOLN
INTRAVENOUS | Status: DC | PRN
  Administered 2016-10-06 (×5): via INTRAVENOUS

## 2016-10-06 MED ORDER — LIDOCAINE 2% (20 MG/ML) 5 ML SYRINGE
INTRAMUSCULAR | Status: DC | PRN
  Administered 2016-10-06: 50 mg via INTRAVENOUS

## 2016-10-06 MED ORDER — SODIUM CHLORIDE 0.9% FLUSH: 3.0000 mL | INTRAVENOUS | Status: DC | PRN

## 2016-10-06 MED ORDER — CHLORHEXIDINE GLUCONATE CLOTH 2 % EX PADS: 6.0000 | MEDICATED_PAD | Freq: Once | CUTANEOUS | Status: DC

## 2016-10-06 MED ORDER — BISACODYL 10 MG RE SUPP: 10.0000 mg | Freq: Every day | RECTAL | Status: DC | PRN

## 2016-10-06 MED ORDER — BUPIVACAINE HCL (PF) 0.5 % IJ SOLN
INTRAMUSCULAR | Status: AC
  Filled 2016-10-06: qty 30

## 2016-10-06 MED ORDER — CEFAZOLIN SODIUM 1 G IJ SOLR
INTRAMUSCULAR | Status: DC | PRN
  Administered 2016-10-06 (×2): 3 g via INTRAMUSCULAR

## 2016-10-06 MED ORDER — ONDANSETRON HCL 4 MG/2ML IJ SOLN
INTRAMUSCULAR | Status: DC | PRN
  Administered 2016-10-06: 4 mg via INTRAVENOUS

## 2016-10-06 MED ORDER — ALBUMIN HUMAN 5 % IV SOLN
INTRAVENOUS | Status: DC | PRN
  Administered 2016-10-06 (×4): via INTRAVENOUS

## 2016-10-06 MED ORDER — MENTHOL 3 MG MT LOZG: 1.0000 | LOZENGE | OROMUCOSAL | Status: DC | PRN

## 2016-10-06 MED ORDER — PHENYLEPHRINE HCL 10 MG/ML IJ SOLN
INTRAVENOUS | Status: DC | PRN
  Administered 2016-10-06: 10 ug/min via INTRAVENOUS

## 2016-10-06 MED ORDER — DEXAMETHASONE SODIUM PHOSPHATE 10 MG/ML IJ SOLN
INTRAMUSCULAR | Status: DC | PRN
  Administered 2016-10-06: 10 mg via INTRAVENOUS

## 2016-10-06 MED ORDER — HYDROMORPHONE HCL 1 MG/ML IJ SOLN
1.0000 mg | INTRAMUSCULAR | Status: DC | PRN
  Administered 2016-10-06 – 2016-10-07 (×3): 1 mg via INTRAVENOUS
  Filled 2016-10-06 (×3): qty 1

## 2016-10-06 MED ORDER — LIDOCAINE-EPINEPHRINE 1 %-1:100000 IJ SOLN
INTRAMUSCULAR | Status: AC
Start: 2016-10-06 — End: 2016-10-06
  Filled 2016-10-06: qty 1

## 2016-10-06 MED ORDER — CEFAZOLIN SODIUM-DEXTROSE 2-4 GM/100ML-% IV SOLN
2.0000 g | Freq: Three times a day (TID) | INTRAVENOUS | Status: AC
  Administered 2016-10-06 – 2016-10-07 (×2): 2 g via INTRAVENOUS
  Filled 2016-10-06 (×2): qty 100

## 2016-10-06 MED ORDER — ZOLPIDEM TARTRATE 5 MG PO TABS: 5.0000 mg | ORAL_TABLET | Freq: Every evening | ORAL | Status: DC | PRN

## 2016-10-06 MED ORDER — OXYCODONE HCL 5 MG/5ML PO SOLN: 5.0000 mg | Freq: Once | ORAL | Status: DC | PRN

## 2016-10-06 MED ORDER — MIDAZOLAM HCL 2 MG/2ML IJ SOLN
INTRAMUSCULAR | Status: AC
  Filled 2016-10-06: qty 2

## 2016-10-06 MED ORDER — PROPOFOL 10 MG/ML IV BOLUS
INTRAVENOUS | Status: AC
  Filled 2016-10-06: qty 40

## 2016-10-06 MED ORDER — MIDAZOLAM HCL 5 MG/5ML IJ SOLN
INTRAMUSCULAR | Status: DC | PRN
  Administered 2016-10-06: 2 mg via INTRAVENOUS

## 2016-10-06 MED ORDER — PHENOL 1.4 % MT LIQD: 1.0000 | OROMUCOSAL | Status: DC | PRN

## 2016-10-06 MED ORDER — SUGAMMADEX SODIUM 200 MG/2ML IV SOLN
INTRAVENOUS | Status: DC | PRN
  Administered 2016-10-06: 248.6 mg via INTRAVENOUS
  Administered 2016-10-06: 50 mg via INTRAVENOUS

## 2016-10-06 MED ORDER — DEXTROSE 5 % IV SOLN: INTRAVENOUS | Status: DC | PRN

## 2016-10-06 MED ORDER — FENTANYL CITRATE (PF) 250 MCG/5ML IJ SOLN
INTRAMUSCULAR | Status: AC
  Filled 2016-10-06: qty 5

## 2016-10-06 MED ORDER — FENTANYL CITRATE (PF) 100 MCG/2ML IJ SOLN
INTRAMUSCULAR | Status: DC | PRN
  Administered 2016-10-06 (×4): 25 ug via INTRAVENOUS
  Administered 2016-10-06: 100 ug via INTRAVENOUS
  Administered 2016-10-06 (×2): 25 ug via INTRAVENOUS

## 2016-10-06 MED ORDER — EPHEDRINE SULFATE-NACL 50-0.9 MG/10ML-% IV SOSY
PREFILLED_SYRINGE | INTRAVENOUS | Status: DC | PRN
Start: 2016-10-06 — End: 2016-10-06
  Administered 2016-10-06 (×2): 5 mg via INTRAVENOUS
  Administered 2016-10-06: 10 mg via INTRAVENOUS

## 2016-10-06 MED ORDER — SENNA 8.6 MG PO TABS
1.0000 | ORAL_TABLET | Freq: Two times a day (BID) | ORAL | Status: DC
  Administered 2016-10-06 – 2016-10-07 (×2): 8.6 mg via ORAL
  Filled 2016-10-06 (×2): qty 1

## 2016-10-06 MED ORDER — BUPIVACAINE HCL (PF) 0.5 % IJ SOLN
INTRAMUSCULAR | Status: DC | PRN
  Administered 2016-10-06: 4.5 mL

## 2016-10-06 MED ORDER — PHENYLEPHRINE 40 MCG/ML (10ML) SYRINGE FOR IV PUSH (FOR BLOOD PRESSURE SUPPORT)
PREFILLED_SYRINGE | INTRAVENOUS | Status: DC | PRN
  Administered 2016-10-06 (×2): 40 ug via INTRAVENOUS
  Administered 2016-10-06: 80 ug via INTRAVENOUS

## 2016-10-06 MED ORDER — OXYCODONE HCL 5 MG PO TABS: 5.0000 mg | ORAL_TABLET | Freq: Once | ORAL | Status: DC | PRN

## 2016-10-06 MED ORDER — 0.9 % SODIUM CHLORIDE (POUR BTL) OPTIME
TOPICAL | Status: DC | PRN
  Administered 2016-10-06: 1000 mL

## 2016-10-06 MED ORDER — FENTANYL CITRATE (PF) 100 MCG/2ML IJ SOLN
25.0000 ug | INTRAMUSCULAR | Status: DC | PRN
  Administered 2016-10-06: 50 ug via INTRAVENOUS

## 2016-10-06 MED ORDER — ROCURONIUM BROMIDE 10 MG/ML (PF) SYRINGE
PREFILLED_SYRINGE | INTRAVENOUS | Status: DC | PRN
  Administered 2016-10-06: 40 mg via INTRAVENOUS
  Administered 2016-10-06 (×2): 10 mg via INTRAVENOUS
  Administered 2016-10-06: 60 mg via INTRAVENOUS
  Administered 2016-10-06 (×3): 10 mg via INTRAVENOUS
  Administered 2016-10-06: 20 mg via INTRAVENOUS
  Administered 2016-10-06: 10 mg via INTRAVENOUS

## 2016-10-06 MED ORDER — PANTOPRAZOLE SODIUM 40 MG IV SOLR
40.0000 mg | Freq: Every day | INTRAVENOUS | Status: DC
  Administered 2016-10-06: 40 mg via INTRAVENOUS
  Filled 2016-10-06: qty 40

## 2016-10-06 MED ORDER — SODIUM CHLORIDE 0.9 % IV SOLN: 250.0000 mL | INTRAVENOUS | Status: DC

## 2016-10-06 MED ORDER — ACETAMINOPHEN 500 MG PO TABS
1000.0000 mg | ORAL_TABLET | Freq: Four times a day (QID) | ORAL | Status: AC
  Administered 2016-10-06 – 2016-10-07 (×4): 1000 mg via ORAL
  Filled 2016-10-06 (×4): qty 2

## 2016-10-06 MED ORDER — PROPOFOL 10 MG/ML IV BOLUS
INTRAVENOUS | Status: DC | PRN
  Administered 2016-10-06: 130 mg via INTRAVENOUS

## 2016-10-06 MED ORDER — ONDANSETRON HCL 4 MG/2ML IJ SOLN
4.0000 mg | Freq: Four times a day (QID) | INTRAMUSCULAR | Status: DC | PRN
Start: 2016-10-06 — End: 2016-10-07

## 2016-10-06 MED ORDER — THROMBIN 5000 UNITS EX SOLR
OROMUCOSAL | Status: DC | PRN
  Administered 2016-10-06 (×2): 5 mL via TOPICAL

## 2016-10-06 MED ORDER — BACITRACIN 50000 UNITS IM SOLR
INTRAMUSCULAR | Status: DC | PRN
  Administered 2016-10-06: 500 mL

## 2016-10-06 MED ORDER — ALUM & MAG HYDROXIDE-SIMETH 200-200-20 MG/5ML PO SUSP
30.0000 mL | Freq: Four times a day (QID) | ORAL | Status: DC | PRN
  Administered 2016-10-07 (×2): 30 mL via ORAL
  Filled 2016-10-06 (×2): qty 30

## 2016-10-06 MED ORDER — HYDROCODONE-ACETAMINOPHEN 7.5-325 MG PO TABS
1.0000 | ORAL_TABLET | Freq: Four times a day (QID) | ORAL | Status: DC
Start: 2016-10-07 — End: 2016-10-07

## 2016-10-06 MED ORDER — THROMBIN 20000 UNITS EX SOLR
CUTANEOUS | Status: AC
  Filled 2016-10-06: qty 20000

## 2016-10-06 MED ORDER — DOCUSATE SODIUM 100 MG PO CAPS
100.0000 mg | ORAL_CAPSULE | Freq: Two times a day (BID) | ORAL | Status: DC
  Administered 2016-10-06 – 2016-10-07 (×2): 100 mg via ORAL
  Filled 2016-10-06 (×2): qty 1

## 2016-10-06 MED ORDER — ONDANSETRON HCL 4 MG PO TABS: 4.0000 mg | ORAL_TABLET | Freq: Four times a day (QID) | ORAL | Status: DC | PRN

## 2016-10-06 MED ORDER — LIDOCAINE-EPINEPHRINE 1 %-1:100000 IJ SOLN
INTRAMUSCULAR | Status: DC | PRN
  Administered 2016-10-06: 4.5 mL

## 2016-10-06 MED ORDER — ACETAMINOPHEN 650 MG RE SUPP: 650.0000 mg | RECTAL | Status: DC | PRN

## 2016-10-06 SURGICAL SUPPLY — 101 items
ADH SKN CLS APL DERMABOND .7 (GAUZE/BANDAGES/DRESSINGS) ×2
APL SKNCLS STERI-STRIP NONHPOA (GAUZE/BANDAGES/DRESSINGS)
BAG DECANTER FOR FLEXI CONT (MISCELLANEOUS) ×4 IMPLANT
BASKET BONE COLLECTION (BASKET) ×2 IMPLANT
BENZOIN TINCTURE PRP APPL 2/3 (GAUZE/BANDAGES/DRESSINGS) IMPLANT
BIT DRILL LONG 3.0X30 (BIT) IMPLANT
BIT DRILL LONG 3X80 (BIT) IMPLANT
BIT DRILL LONG 4X80 (BIT) IMPLANT
BIT DRILL SHORT 3.0X30 (BIT) IMPLANT
BIT DRILL SHORT 3X80 (BIT) IMPLANT
BLADE CLIPPER SURG (BLADE) ×3 IMPLANT
BLADE SURG 11 STRL SS (BLADE) ×5 IMPLANT
BUR MATCHSTICK NEURO 3.0 LAGG (BURR) ×4 IMPLANT
BUR PRECISION FLUTE 5.0 (BURR) ×4 IMPLANT
CANISTER SUCT 3000ML PPV (MISCELLANEOUS) ×4 IMPLANT
CARTRIDGE OIL MAESTRO DRILL (MISCELLANEOUS) ×2 IMPLANT
CLOSURE WOUND 1/2 X4 (GAUZE/BANDAGES/DRESSINGS)
CONT SPEC 4OZ CLIKSEAL STRL BL (MISCELLANEOUS) ×4 IMPLANT
CORD BIPOLAR FORCEPS 12FT (ELECTRODE) ×3 IMPLANT
COVER BACK TABLE 60X90IN (DRAPES) ×4 IMPLANT
DECANTER SPIKE VIAL GLASS SM (MISCELLANEOUS) ×4 IMPLANT
DERMABOND ADVANCED (GAUZE/BANDAGES/DRESSINGS) ×2
DERMABOND ADVANCED .7 DNX12 (GAUZE/BANDAGES/DRESSINGS) ×2 IMPLANT
DEVICE INTERBODY ELEVATE 23X8 (Cage) ×16 IMPLANT
DIFFUSER DRILL AIR PNEUMATIC (MISCELLANEOUS) ×4 IMPLANT
DRAPE C-ARM 42X72 X-RAY (DRAPES) ×4 IMPLANT
DRAPE C-ARMOR (DRAPES) ×4 IMPLANT
DRAPE INCISE IOBAN 66X45 STRL (DRAPES) ×3 IMPLANT
DRAPE LAPAROTOMY 100X72X124 (DRAPES) ×4 IMPLANT
DRAPE POUCH INSTRU U-SHP 10X18 (DRAPES) ×4 IMPLANT
DRAPE SHEET LG 3/4 BI-LAMINATE (DRAPES) ×4 IMPLANT
DRAPE SURG 17X23 STRL (DRAPES) ×4 IMPLANT
DRSG OPSITE POSTOP 4X8 (GAUZE/BANDAGES/DRESSINGS) ×3 IMPLANT
DURAPREP 26ML APPLICATOR (WOUND CARE) ×4 IMPLANT
ELECT CAUTERY BLADE 6.4 (BLADE) ×3 IMPLANT
ELECT REM PT RETURN 9FT ADLT (ELECTROSURGICAL) ×4
ELECTRODE REM PT RTRN 9FT ADLT (ELECTROSURGICAL) ×2 IMPLANT
GAUZE SPONGE 4X4 12PLY STRL (GAUZE/BANDAGES/DRESSINGS) IMPLANT
GAUZE SPONGE 4X4 16PLY XRAY LF (GAUZE/BANDAGES/DRESSINGS) ×6 IMPLANT
GLOVE BIO SURGEON STRL SZ7 (GLOVE) IMPLANT
GLOVE BIO SURGEON STRL SZ7.5 (GLOVE) ×6 IMPLANT
GLOVE BIO SURGEON STRL SZ8 (GLOVE) ×3 IMPLANT
GLOVE BIOGEL PI IND STRL 6.5 (GLOVE) ×2 IMPLANT
GLOVE BIOGEL PI IND STRL 7.0 (GLOVE) IMPLANT
GLOVE BIOGEL PI IND STRL 7.5 (GLOVE) ×4 IMPLANT
GLOVE BIOGEL PI INDICATOR 6.5 (GLOVE) ×4
GLOVE BIOGEL PI INDICATOR 7.0 (GLOVE)
GLOVE BIOGEL PI INDICATOR 7.5 (GLOVE) ×6
GLOVE ECLIPSE 7.0 STRL STRAW (GLOVE) ×7 IMPLANT
GLOVE EXAM NITRILE LRG STRL (GLOVE) IMPLANT
GLOVE EXAM NITRILE XL STR (GLOVE) IMPLANT
GLOVE EXAM NITRILE XS STR PU (GLOVE) IMPLANT
GLOVE SURG SS PI 6.5 STRL IVOR (GLOVE) ×9 IMPLANT
GLOVE SURG SS PI 7.5 STRL IVOR (GLOVE) ×6 IMPLANT
GOWN STRL REUS W/ TWL LRG LVL3 (GOWN DISPOSABLE) ×8 IMPLANT
GOWN STRL REUS W/ TWL XL LVL3 (GOWN DISPOSABLE) ×1 IMPLANT
GOWN STRL REUS W/TWL 2XL LVL3 (GOWN DISPOSABLE) IMPLANT
GOWN STRL REUS W/TWL LRG LVL3 (GOWN DISPOSABLE) ×24
GOWN STRL REUS W/TWL XL LVL3 (GOWN DISPOSABLE) ×4
GUIDEWIRE 18IN BLUNT CD HORIZ (WIRE) ×18 IMPLANT
HEMOSTAT POWDER KIT SURGIFOAM (HEMOSTASIS) ×7 IMPLANT
KIT BASIN OR (CUSTOM PROCEDURE TRAY) ×4 IMPLANT
KIT INFUSE SMALL (Orthopedic Implant) ×3 IMPLANT
KIT POSITION SURG JACKSON T1 (MISCELLANEOUS) ×4 IMPLANT
KIT ROOM TURNOVER OR (KITS) ×4 IMPLANT
KIT SPINE MAZOR X ROBO DISP (MISCELLANEOUS) ×3 IMPLANT
NDL HYPO 18GX1.5 BLUNT FILL (NEEDLE) IMPLANT
NDL HYPO 25X1 1.5 SAFETY (NEEDLE) ×1 IMPLANT
NDL SPNL 18GX3.5 QUINCKE PK (NEEDLE) IMPLANT
NEEDLE HYPO 18GX1.5 BLUNT FILL (NEEDLE) IMPLANT
NEEDLE HYPO 22GX1.5 SAFETY (NEEDLE) ×3 IMPLANT
NEEDLE HYPO 25X1 1.5 SAFETY (NEEDLE) IMPLANT
NEEDLE SPNL 18GX3.5 QUINCKE PK (NEEDLE) ×4 IMPLANT
NS IRRIG 1000ML POUR BTL (IV SOLUTION) ×4 IMPLANT
OIL CARTRIDGE MAESTRO DRILL (MISCELLANEOUS) ×4
PACK LAMINECTOMY NEURO (CUSTOM PROCEDURE TRAY) ×4 IMPLANT
PAD ARMBOARD 7.5X6 YLW CONV (MISCELLANEOUS) ×12 IMPLANT
PATTIES SURGICAL 1X1 (DISPOSABLE) ×3 IMPLANT
PIN HEAD 2.5X60MM (PIN) IMPLANT
ROD SOLERA 70MM (Rod) ×8 IMPLANT
ROD SOLERA 70X5.5XNS TI (Rod) ×2 IMPLANT
SCREW SCHANZ SA 4.0MM (MISCELLANEOUS) IMPLANT
SCREW SET SOLERA (Screw) ×24 IMPLANT
SCREW SET SOLERA TI5.5 (Screw) ×6 IMPLANT
SCREW SOLERA 45X6.5XMA NS SPNE (Screw) ×6 IMPLANT
SCREW SOLERA 6.5X45MM (Screw) ×24 IMPLANT
SEALER BIPOLAR AQUA 6.0 (INSTRUMENTS) ×3 IMPLANT
SPACER SPNL XLORDOTIC 23X8X (Cage) ×4 IMPLANT
SPCR SPNL XLORDOTIC 23X8X (Cage) ×8 IMPLANT
SPONGE LAP 4X18 X RAY DECT (DISPOSABLE) IMPLANT
SPONGE SURGIFOAM ABS GEL 100 (HEMOSTASIS) ×4 IMPLANT
STRIP CLOSURE SKIN 1/2X4 (GAUZE/BANDAGES/DRESSINGS) IMPLANT
SUT VIC AB 0 CT1 18XCR BRD8 (SUTURE) ×3 IMPLANT
SUT VIC AB 0 CT1 8-18 (SUTURE) ×8
SUT VICRYL 3-0 RB1 18 ABS (SUTURE) ×10 IMPLANT
SYR 3ML LL SCALE MARK (SYRINGE) ×12 IMPLANT
TOWEL GREEN STERILE (TOWEL DISPOSABLE) ×3 IMPLANT
TOWEL GREEN STERILE FF (TOWEL DISPOSABLE) ×4 IMPLANT
TRAP SPECIMEN MUCOUS 40CC (MISCELLANEOUS) ×1 IMPLANT
TRAY FOLEY W/METER SILVER 16FR (SET/KITS/TRAYS/PACK) ×4 IMPLANT
WATER STERILE IRR 1000ML POUR (IV SOLUTION) ×4 IMPLANT

## 2016-10-06 NOTE — Anesthesia Preprocedure Evaluation (Signed)
Anesthesia Evaluation  Patient identified by MRN, date of birth, ID band Patient awake    Reviewed: Allergy & Precautions, NPO status , Patient's Chart, lab work & pertinent test results  Airway Mallampati: III  TM Distance: >3 FB Neck ROM: Full    Dental  (+) Teeth Intact, Dental Advisory Given   Pulmonary Current Smoker,    breath sounds clear to auscultation       Cardiovascular  Rhythm:Regular Rate:Normal     Neuro/Psych    GI/Hepatic   Endo/Other  diabetes  Renal/GU      Musculoskeletal   Abdominal   Peds  Hematology   Anesthesia Other Findings   Reproductive/Obstetrics                             Anesthesia Physical Anesthesia Plan  ASA: III  Anesthesia Plan: General   Post-op Pain Management:    Induction: Intravenous  Airway Management Planned: Oral ETT  Additional Equipment:   Intra-op Plan:   Post-operative Plan: Extubation in OR  Informed Consent: I have reviewed the patients History and Physical, chart, labs and discussed the procedure including the risks, benefits and alternatives for the proposed anesthesia with the patient or authorized representative who has indicated his/her understanding and acceptance.   Dental advisory given  Plan Discussed with: CRNA and Anesthesiologist  Anesthesia Plan Comments:         Anesthesia Quick Evaluation

## 2016-10-06 NOTE — Brief Op Note (Signed)
10/06/2016  3:26 PM  PATIENT:  Kenneth Thompson  66 y.o. male  PRE-OPERATIVE DIAGNOSIS:  SPONDYLOLISTHESIS, LUMBAR REGION  POST-OPERATIVE DIAGNOSIS:  SPONDYLOLISTHESIS,LUMBAR REGION  PROCEDURE:  Procedure(s): POSTERIOR LUMBAR INTERBODY FUSION LUMBAR FOUR-FIVE,LUMBAR FIVE-SACRAL ONE (N/A) APPLICATION OF ROBOTIC ASSISTANCE FOR SPINAL PROCEDURE (N/A)  SURGEON:  Surgeon(s) and Role:    * Kenneth Thompson, Kenneth Kopec, MD - Primary    * Tia AlertJones, David S, MD - Assisting  PHYSICIAN ASSISTANT: Cindra PresumeVincent Costella, PA-C  ANESTHESIA:   general  EBL:  Total I/O In: 5500 [I.V.:4500; IV Piggyback:1000] Out: 2500 [Urine:675; Blood:1825]  BLOOD ADMINISTERED:none  DRAINS: none   LOCAL MEDICATIONS USED:  BUPIVICAINE  and LIDOCAINE   SPECIMEN:  No Specimen  DISPOSITION OF SPECIMEN:  N/A  COUNTS:  YES  TOURNIQUET:  * No tourniquets in log *  DICTATION: .Note written in EPIC  PLAN OF CARE: Admit to inpatient   PATIENT DISPOSITION:  PACU - hemodynamically stable.   Delay start of Pharmacological VTE agent (>24hrs) due to surgical blood loss or risk of bleeding: yes

## 2016-10-06 NOTE — H&P (Signed)
CC:  Back and leg pain  HPI: Kenneth Thompson presents initially to [facility] for evaluation of his chronic low back pain. He reports pain started in June of 2017 without known cause. It started in his right hip and glute with radiation down to his toes. The radiation involves his whole leg, there is no specific pattern. In the past week, the left side started become affected with the left pain being being significantly worse than the right. Left is in the same pattern, from the glute to the toes. He reports over the last 2 days he has now been walking with a walker. He endorses neurogenic claudication like symptoms. Pain is constantly a 10/10, aching, throbbing pain. Pain is typically exacerbated by walking and standing/sitting for long periods of time. He has not found any relieving factors. He has been on gabapentin and baclofen and Norco for 2 years. His dose has been adjusted without improvement in his pain. 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 No other lumbar surgeries. He went to his primary care doctor who thought the pain may be stemming from his hip so hip MRI was ordered and per pt it was normal. This led to the lumbar spine MRI. He is not on anticoagulations. He denies history of CVA/MI. He denies bowel or bladder dysfunction, numbness/tingling of the lower extremities.   PMH: Past Medical History:  Diagnosis Date  . Anxiety   . Arthritis   . Diabetes (HCC)   . High cholesterol   . Neuropathic pain of lower extremity   . Sleep apnea    CPAP     over 5 years ago   does not know where done    PSH: Past Surgical History:  Procedure Laterality Date  . Back Surgeries  2012   Total 3 back sirgeries  . BACK SURGERY     Spinal AV fistula  . EYE SURGERY Bilateral 2017   cataracts removed  . Right foot/ ankle reconstruction      SH: Social History  Substance Use Topics  . Smoking status: Current Every Day Smoker    Packs/day: 0.25   Years: 35.00  . Smokeless tobacco: Never Used  . Alcohol use No     Comment: quit 4-5 months ago    MEDS: Prior to Admission medications   Medication Sig Start Date End Date Taking? Authorizing Provider  aspirin 81 MG tablet Take 81 mg by mouth daily.   Yes [provider]  atenolol (TENORMIN) 50 MG tablet Take 50 mg by mouth daily. 05/01/16  Yes [provider]  baclofen (LIORESAL) 10 MG tablet Take 3 tablets (30 mg total) by mouth 3 (three) times daily. 05/25/16  Yes Levert FeinsteinYan, Yijun, MD  gabapentin (NEURONTIN) 600 MG tablet Take 2 tablets (1,200 mg total) by mouth 4 (four) times daily. 08/30/16  Yes Levert FeinsteinYan, Yijun, MD  gemfibrozil (LOPID) 600 MG tablet Take 600 mg by mouth 2 (two) times daily.  01/10/13  Yes [provider]  glimepiride (AMARYL) 4 MG tablet TAKE 1 TAB BY MOUTH ONCE DAILY WITH BREAKFAST OR FIRST MAIN MEAL OF THE DAY 11/26/14  Yes [provider]  losartan (COZAAR) 100 MG tablet Take 100 mg by mouth daily.  01/05/13  Yes [provider]  metFORMIN (GLUCOPHAGE) 500 MG tablet Take 1,000 mg by mouth 2 (two) times daily.    Yes [provider]  OXcarbazepine ER (OXTELLAR XR) 150 MG TB24 Take 450 mg by  mouth 2 (two) times daily. 05/25/16  Yes Levert Feinstein, MD  oxyCODONE-acetaminophen (PERCOCET/ROXICET) 5-325 MG tablet Take 0.5 tablets by mouth 2 (two) times daily as needed for severe pain.   Yes [provider]  HYDROcodone-acetaminophen (NORCO/VICODIN) 5-325 MG per tablet Take 0.5-1 tablets by mouth daily as needed for moderate pain or severe pain.  08/17/14   [provider]  ONE TOUCH ULTRA TEST test strip  01/05/13   [provider]    ALLERGY: No Known Allergies  ROS: ROS  NEUROLOGIC EXAM: Awake, alert, oriented Memory and concentration grossly intact Speech fluent, appropriate CN grossly intact Motor exam: Upper Extremities Deltoid Bicep Tricep Grip  Right 5/5 5/5 5/5 5/5  Left 5/5 5/5 5/5 5/5   Lower  Extremity IP Quad PF DF EHL  Right 5/5 5/5 5/5 5/5 5/5  Left 5/5 5/5 5/5 5/5 5/5   Sensation grossly intact to LT  [facility]: MRI of the lumbar spine dated 09/07/2016 was reviewed. This demonstrates primary pathology at L4-5, where there is severe bilateral facet arthropathy with joint diastasis, and concurrent ligamentum flavum hypertrophy. There is also a broad-based disc bulge/uncover disc related to anterolisthesis of L4 on L5. There is resultant moderate central stenosis. There is grade 1 anterolisthesis of L5 on S1, without stenosis. It is unclear whether there may be a pars defect of L5. In comparison to MRI done previously 3 years ago, there has been significant progression of facet arthropathy and stenosis, as well as worsening of the spondylolisthesis.   Dynamic lumbar spine x-rays done in the office today were reviewed. These do demonstrate mobility of the L4-5 spondylolisthesis. There does not appear to significant motion of the grade 1 L5-S1 anterolisthesis.  CT Lspine was reviewed. In addition to the findings at L4-5, there is also significant facet arthropathy bilaterally at L5-S1, and grade 1 anterolisthesis.  IMPRESSION: 66 year old man with back and leg pain likely related to progressive mobile spondylolisthesis with stenosis at L4-5. There is also spondylolisthesis at L5-S1, unclear whether there is a pars defect at this level. With his symptoms, I do think he needs to be decompressed and stabilized at L4-5. With the significant findings of arthropathy and spondylolisthesis at L5-S1, I do think that carrying the construct down to S1 is reasonable.  PLAN: - Proceed with L4-5, L5-S1 PLIF  I have reviewed the indications, risks, benefits, and alternatives to surgery in detail with the patient and his wife in the office. All questions were answered.

## 2016-10-06 NOTE — Transfer of Care (Signed)
Immediate Anesthesia Transfer of Care Note  Patient: Kenneth Thompson  Procedure(s) Performed: Procedure(s): POSTERIOR LUMBAR INTERBODY FUSION LUMBAR FOUR-FIVE,LUMBAR FIVE-SACRAL ONE (N/A) APPLICATION OF ROBOTIC ASSISTANCE FOR SPINAL PROCEDURE (N/A)  Patient Location: PACU  Anesthesia Type:General  Level of Consciousness: awake and alert   Airway & Oxygen Therapy: Patient Spontanous Breathing and Patient connected to nasal cannula oxygen  Post-op Assessment: Report given to RN and Post -op Vital signs reviewed and stable  Post vital signs: Reviewed and stable  Last Vitals:  Vitals:   10/06/16 0602  BP: 113/71  Pulse: (!) 52  Resp: 20  Temp: 36.6 C    Last Pain:  Vitals:   10/06/16 0602  TempSrc: Oral      Patients Stated Pain Goal: 2 (10/06/16 0622)  Complications: No apparent anesthesia complications

## 2016-10-06 NOTE — Anesthesia Procedure Notes (Signed)
Procedure Name: Intubation Date/Time: 10/06/2016 9:29 AM Performed by: Merdis Delay Pre-anesthesia Checklist: Patient identified, Emergency Drugs available, Suction available, Patient being monitored and Timeout performed Patient Re-evaluated:Patient Re-evaluated prior to inductionOxygen Delivery Method: Circle system utilized Preoxygenation: Pre-oxygenation with 100% oxygen Intubation Type: IV induction Ventilation: Mask ventilation without difficulty and Oral airway inserted - appropriate to patient size Laryngoscope Size: Mac and 4 Grade View: Grade II Tube type: Oral Tube size: 8.0 mm Number of attempts: 1 Airway Equipment and Method: Stylet Placement Confirmation: ETT inserted through vocal cords under direct vision,  positive ETCO2 and breath sounds checked- equal and bilateral Secured at: 22 cm Tube secured with: Tape Dental Injury: Teeth and Oropharynx as per pre-operative assessment

## 2016-10-07 LAB — CBC
HCT: 32.1 % — ABNORMAL LOW (ref 39.0–52.0)
Hemoglobin: 10.6 g/dL — ABNORMAL LOW (ref 13.0–17.0)
MCH: 29.9 pg (ref 26.0–34.0)
MCHC: 33 g/dL (ref 30.0–36.0)
MCV: 90.4 fL (ref 78.0–100.0)
Platelets: 184 10*3/uL (ref 150–400)
RBC: 3.55 MIL/uL — ABNORMAL LOW (ref 4.22–5.81)
RDW: 12.5 % (ref 11.5–15.5)
WBC: 11.6 10*3/uL — ABNORMAL HIGH (ref 4.0–10.5)

## 2016-10-07 LAB — POCT I-STAT 4, (NA,K, GLUC, HGB,HCT)
GLUCOSE: 153 mg/dL — AB (ref 65–99)
Glucose, Bld: 141 mg/dL — ABNORMAL HIGH (ref 65–99)
HCT: 34 % — ABNORMAL LOW (ref 39.0–52.0)
HEMATOCRIT: 40 % (ref 39.0–52.0)
HEMOGLOBIN: 11.6 g/dL — AB (ref 13.0–17.0)
Hemoglobin: 13.6 g/dL (ref 13.0–17.0)
POTASSIUM: 4.7 mmol/L (ref 3.5–5.1)
Potassium: 5.1 mmol/L (ref 3.5–5.1)
SODIUM: 134 mmol/L — AB (ref 135–145)
Sodium: 134 mmol/L — ABNORMAL LOW (ref 135–145)

## 2016-10-07 LAB — BASIC METABOLIC PANEL
Anion gap: 9 (ref 5–15)
BUN: 13 mg/dL (ref 6–20)
CO2: 26 mmol/L (ref 22–32)
Calcium: 8.8 mg/dL — ABNORMAL LOW (ref 8.9–10.3)
Chloride: 100 mmol/L — ABNORMAL LOW (ref 101–111)
Creatinine, Ser: 0.81 mg/dL (ref 0.61–1.24)
GFR calc Af Amer: >60 mL/min (ref >60)
GFR calc non Af Amer: >60 mL/min (ref >60)
Glucose, Bld: 127 mg/dL — ABNORMAL HIGH (ref 65–99)
Potassium: 4.6 mmol/L (ref 3.5–5.1)
Sodium: 135 mmol/L (ref 135–145)

## 2016-10-07 LAB — GLUCOSE, CAPILLARY: GLUCOSE-CAPILLARY: 118 mg/dL — AB (ref 65–99)

## 2016-10-07 LAB — APTT: aPTT: 30 s (ref 24–36)

## 2016-10-07 LAB — PROTIME-INR
INR: 1.17
Prothrombin Time: 14.9 s (ref 11.4–15.2)

## 2016-10-07 MED ORDER — METFORMIN HCL 500 MG PO TABS: 1000.0000 mg | ORAL_TABLET | Freq: Two times a day (BID) | ORAL | Status: DC

## 2016-10-07 MED ORDER — METFORMIN HCL 500 MG PO TABS
1000.0000 mg | ORAL_TABLET | Freq: Two times a day (BID) | ORAL | Status: DC
  Administered 2016-10-07: 1000 mg via ORAL
  Filled 2016-10-07 (×2): qty 2

## 2016-10-07 MED ORDER — TAMSULOSIN HCL 0.4 MG PO CAPS
0.4000 mg | ORAL_CAPSULE | ORAL | Status: AC
  Administered 2016-10-07: 0.4 mg via ORAL
  Filled 2016-10-07: qty 1

## 2016-10-07 MED ORDER — GABAPENTIN 300 MG PO CAPS
900.0000 mg | ORAL_CAPSULE | Freq: Once | ORAL | Status: AC
  Administered 2016-10-07: 900 mg via ORAL

## 2016-10-07 MED ORDER — OXYCODONE-ACETAMINOPHEN 10-325 MG PO TABS: 1.0000 | ORAL_TABLET | ORAL | 0 refills | Status: DC | PRN

## 2016-10-07 MED ORDER — GABAPENTIN 600 MG PO TABS
1200.0000 mg | ORAL_TABLET | Freq: Three times a day (TID) | ORAL | Status: DC
  Administered 2016-10-07: 1200 mg via ORAL
  Filled 2016-10-07: qty 2

## 2016-10-07 NOTE — Evaluation (Signed)
Physical Therapy Evaluation Patient Details Name: Kenneth Thompson MRN: [id number] DOB: Jan 05, 1951 Today's Date: 10/07/2016   History of Present Illness  Pt is a 66 y/o male who presents s/p L4-S1 PLIF on 10/06/16. PMH significant for prior spinal surgery, neuropathy, DM.   Clinical Impression  Pt admitted with above diagnosis. Pt currently with functional limitations due to the deficits listed below (see PT Problem List). At the time of PT eval pt was able to perform transfers and ambulation with gross supervision for safety. He was able to negotiate stairs utilizing rail and sideways step technique. Pt was educated on walking program, brace application/wearing schedule, car transfer, and general safety at home. Pt will benefit from skilled PT to increase their independence and safety with mobility to allow discharge to the venue listed below.       Follow Up Recommendations No PT follow up;Supervision for mobility/OOB    Equipment Recommendations  None recommended by PT    Recommendations for Other Services       Precautions / Restrictions Precautions Precautions: Fall;Back Precaution Booklet Issued: Yes (comment) Precaution Comments: Reviewed handout in detail and pt was cued for precautions during functional mobility.  Required Braces or Orthoses: Spinal Brace Spinal Brace: Thoracolumbosacral orthotic;Applied in sitting position Restrictions Weight Bearing Restrictions: No      Mobility  Bed Mobility Overal bed mobility: Modified Independent                Transfers Overall transfer level: Needs assistance Equipment used: Rolling walker (2 wheeled) Transfers: Sit to/from Stand Sit to Stand: Supervision         General transfer comment: Close supervision for safety. Pt tall and required increased time to power-up to full stand and control his descent to/from low bed height.   Ambulation/Gait Ambulation/Gait assistance: Supervision Ambulation Distance (Feet): 150  Feet Assistive device: Rolling walker (2 wheeled) Gait Pattern/deviations: Step-through pattern;Decreased stride length;Trunk flexed Gait velocity: Decreased Gait velocity interpretation: Below normal speed for age/gender General Gait Details: Distance limited as pt reports he has already ambulated a good distance this morning, and reports increased neuropathy pain in feet and lower legs.  Stairs Stairs: Yes Stairs assistance: Min guard Stair Management: One rail Right;Step to pattern;Sideways Number of Stairs: 2 General stair comments: VC's for proper posture and maintenance of precautions.   Wheelchair Mobility    Modified Rankin (Stroke Patients Only)       Balance Overall balance assessment: Needs assistance Sitting-balance support: Feet supported;No upper extremity supported Sitting balance-Leahy Scale: Good     Standing balance support: No upper extremity supported;During functional activity Standing balance-Leahy Scale: Poor Standing balance comment: Requires UE support for dynamic standing balance                              Pertinent Vitals/Pain Pain Assessment: 0-10 Pain Score: 6  Pain Location: Incision site Pain Descriptors / Indicators: Operative site guarding;Discomfort Pain Intervention(s): Limited activity within patient's tolerance;Monitored during session;Repositioned    Home Living Family/patient expects to be discharged to:: Private residence Living Arrangements: Spouse/significant other Available Help at Discharge: Family;Available PRN/intermittently Type of Home: House Home Access: Stairs to enter Entrance Stairs-Rails: Doctor, general practice of Steps: 2 Home Layout: One level Home Equipment: Walker - 2 wheels      Prior Function Level of Independence: Independent with assistive device(s)         Comments: Uses walker all the time     Hand  Dominance        Extremity/Trunk Assessment   Upper Extremity  Assessment Upper Extremity Assessment: Defer to OT evaluation    Lower Extremity Assessment Lower Extremity Assessment: Generalized weakness    Cervical / Trunk Assessment Cervical / Trunk Assessment: Other exceptions Cervical / Trunk Exceptions: Forward head/rounded shoulders  Communication   Communication: No difficulties  Cognition Arousal/Alertness: Awake/alert Behavior During Therapy: WFL for tasks assessed/performed Overall Cognitive Status: Within Functional Limits for tasks assessed                                        General Comments      Exercises     Assessment/Plan    PT Assessment Patient needs continued PT services  PT Problem List Decreased strength;Decreased range of motion;Decreased activity tolerance;Decreased balance;Decreased mobility;Decreased knowledge of use of DME;Decreased safety awareness;Decreased knowledge of precautions;Pain       PT Treatment Interventions DME instruction;Gait training;Stair training;Functional mobility training;Therapeutic activities;Therapeutic exercise;Neuromuscular re-education;Patient/family education    PT Goals (Current goals can be found in the Care Plan section)  Acute Rehab PT Goals Patient Stated Goal: Home today PT Goal Formulation: With patient Time For Goal Achievement: 10/14/16 Potential to Achieve Goals: Good    Frequency Min 5X/week   Barriers to discharge Decreased caregiver support Wife works and pt is home alone during the day    Co-evaluation               AM-PAC PT "6 Clicks" Daily Activity  Outcome Measure Difficulty turning over in bed (including adjusting bedclothes, sheets and blankets)?: None Difficulty moving from lying on back to sitting on the side of the bed? : None Difficulty sitting down on and standing up from a chair with arms (e.g., wheelchair, bedside commode, etc,.)?: Total Help needed moving to and from a bed to chair (including a wheelchair)?: A  Little Help needed walking in hospital room?: A Little Help needed climbing 3-5 steps with a railing? : A Little 6 Click Score: 18    End of Session Equipment Utilized During Treatment: Gait belt;Back brace Activity Tolerance: Patient tolerated treatment well Patient left: in bed;with call bell/phone within reach Nurse Communication: Mobility status PT Visit Diagnosis: Unsteadiness on feet (R26.81);Pain Pain - Right/Left: Left Pain - part of body: Leg (Back)    Time: [id number] PT Time Calculation (min) (ACUTE ONLY): 24 min   Charges:   PT Evaluation $PT Eval Moderate Complexity: 1 Procedure PT Treatments $Gait Training: 8-22 mins   PT G Codes:        Conni SlipperLaura Tran Randle, PT, DPT Acute Rehabilitation Services Pager: [phone]   Marylynn PearsonLaura D Cru Kritikos 10/07/2016, 9:59 AM

## 2016-10-07 NOTE — Anesthesia Postprocedure Evaluation (Addendum)
Anesthesia Post Note  Patient: Kenneth KohutLarry F Shere  Procedure(s) Performed: Procedure(s) (LRB): POSTERIOR LUMBAR INTERBODY FUSION LUMBAR FOUR-FIVE,LUMBAR FIVE-SACRAL ONE (N/A) APPLICATION OF ROBOTIC ASSISTANCE FOR SPINAL PROCEDURE (N/A)  Patient location during evaluation: PACU Anesthesia Type: General Level of consciousness: awake and oriented Pain management: pain level controlled Vital Signs Assessment: post-procedure vital signs reviewed and stable Respiratory status: spontaneous breathing, nonlabored ventilation and respiratory function stable Cardiovascular status: blood pressure returned to baseline Anesthetic complications: no       Last Vitals:  Vitals:   10/07/16 0400 10/07/16 0800  BP: 136/79 113/75  Pulse: 68 89  Resp: 17   Temp: 36.7 C 36.8 C    Last Pain:  Vitals:   10/07/16 1026  TempSrc:   PainSc: 6                  Shanan Fitzpatrick COKER

## 2016-10-07 NOTE — Evaluation (Signed)
Occupational Therapy Evaluation and Discharge Patient Details Name: Kenneth Thompson MRN: [id number] DOB: [birth year] Today's Date: 10/07/2016    History of Present Illness Pt is a 66 y/o male who presents s/p L4-S1 PLIF on 10/06/16. PMH significant for prior spinal surgery, neuropathy, DM.    Clinical Impression   Pt reports he was mod I with ADL and functional mobility PTA. Currently pt overall min guard for functional mobility, supervision for ADL with the exception of min assist for LB ADL. All back, safety, and ADL education completed with pt and wife. Pt planning to d/c home with 24/7 supervision from his wife initially. No further acute OT needs identified; signing off at this time. Please re-consult if needs change. Thank you for this referral.    Follow Up Recommendations  No OT follow up;Supervision/Assistance - 24 hour (initially)    Equipment Recommendations  None recommended by OT    Recommendations for Other Services       Precautions / Restrictions Precautions Precautions: Fall;Back Precaution Booklet Issued: No Precaution Comments: Pt able to verbally recall 3/3 precautions Required Braces or Orthoses: Spinal Brace Spinal Brace: Thoracolumbosacral orthotic;Applied in sitting position Restrictions Weight Bearing Restrictions: No      Mobility Bed Mobility Overal bed mobility: Modified Independent             General bed mobility comments: No physical assist, good technique, use of bed rails  Transfers Overall transfer level: Needs assistance Equipment used: Rolling walker (2 wheeled) Transfers: Sit to/from Stand Sit to Stand: Min guard         General transfer comment: for safety. Pt mildly unsteady on feet initially with sit to stand from EOB    Balance Overall balance assessment: Needs assistance Sitting-balance support: Feet supported;No upper extremity supported Sitting balance-Leahy Scale: Good     Standing balance support: No upper  extremity supported;During functional activity Standing balance-Leahy Scale: Fair Standing balance comment: Requires UE support for dynamic standing balance                            ADL either performed or assessed with clinical judgement   ADL Overall ADL's : Needs assistance/impaired Eating/Feeding: Set up;Sitting   Grooming: Min guard;Standing Grooming Details (indicate cue type and reason): Educated on use of 2 cups for oral care Upper Body Bathing: Set up;Supervision/ safety;Sitting   Lower Body Bathing: Min guard;Sit to/from stand   Upper Body Dressing : Set up;Supervision/safety;Sitting Upper Body Dressing Details (indicate cue type and reason): to don/doff brace Lower Body Dressing: Minimal assistance;Sit to/from stand Lower Body Dressing Details (indicate cue type and reason): Wife to assist with LB ADL as needed. Educated on compensatory strategies for LB dressing Toilet Transfer: Min guard;Ambulation;RW Toilet Transfer Details (indicate cue type and reason): Simulated by sit to stand from EOB with functional mobility   Toileting - Clothing Manipulation Details (indicate cue type and reason): Educated on proper technique for peri care without twisting and use of wet wipes Tub/ Shower Transfer: Min guard;Walk-in shower;Ambulation;Shower Dealer Details (indicate cue type and reason): Recommend supervision for shower transfer initially; wife agreeable Functional mobility during ADLs: Min guard;Rolling walker General ADL Comments: Educated on maintaining back precautions during functional activities, keeping frequently used items at counter top height, log roll technique for bed mobility, brace management and wear schedule, frequent mobility throghout the day upon return home     Vision  Perception     Praxis      Pertinent Vitals/Pain Pain Assessment: Faces Pain Score: 6  Faces Pain Scale: Hurts even more Pain  Location: back Pain Descriptors / Indicators: Aching;Sore Pain Intervention(s): Monitored during session;Limited activity within patient's tolerance     Hand Dominance     Extremity/Trunk Assessment Upper Extremity Assessment Upper Extremity Assessment: Generalized weakness   Lower Extremity Assessment Lower Extremity Assessment: Defer to PT evaluation   Cervical / Trunk Assessment Cervical / Trunk Assessment: Other exceptions Cervical / Trunk Exceptions: Forward head/rounded shoulders, s/p spinal sx   Communication Communication Communication: No difficulties   Cognition Arousal/Alertness: Awake/alert Behavior During Therapy: WFL for tasks assessed/performed Overall Cognitive Status: Within Functional Limits for tasks assessed                                     General Comments       Exercises     Shoulder Instructions      Home Living Family/patient expects to be discharged to:: Private residence Living Arrangements: Spouse/significant other Available Help at Discharge: Family;Available PRN/intermittently (initially 24/7) Type of Home: House Home Access: Stairs to enter Entergy CorporationEntrance Stairs-Number of Steps: 2 Entrance Stairs-Rails: Right;Left Home Layout: One level     Bathroom Shower/Tub: Chief Strategy OfficerTub/shower unit   Bathroom Toilet: Handicapped height     Home Equipment: Environmental consultantWalker - 2 wheels;Shower seat          Prior Functioning/Environment Level of Independence: Independent with assistive device(s)        Comments: Uses walker all the time        OT Problem List:        OT Treatment/Interventions:      OT Goals(Current goals can be found in the care plan section) Acute Rehab OT Goals Patient Stated Goal: Home today OT Goal Formulation: All assessment and education complete, DC therapy  OT Frequency:     Barriers to D/C:            Co-evaluation              AM-PAC PT "6 Clicks" Daily Activity     Outcome Measure Help from  another person eating meals?: None   Help from another person toileting, which includes using toliet, bedpan, or urinal?: A Little Help from another person bathing (including washing, rinsing, drying)?: A Little Help from another person to put on and taking off regular upper body clothing?: A Little Help from another person to put on and taking off regular lower body clothing?: A Little 6 Click Score: 16   End of Session Equipment Utilized During Treatment: Rolling walker;Back brace Nurse Communication: Mobility status;Other (comment) (no f/u or equipment needs)  Activity Tolerance: Patient tolerated treatment well Patient left: in bed;with call bell/phone within reach;with family/visitor present  OT Visit Diagnosis: Other abnormalities of gait and mobility (R26.89);Pain Pain - part of body:  (back)                Time: [id number] OT Time Calculation (min): 19 min Charges:  OT General Charges $OT Visit: 1 Procedure OT Evaluation $OT Eval Moderate Complexity: 1 Procedure G-Codes:     Ramona Slinger A. Brett Albinooffey, M.S., OTR/L Pager: [phone]  Gaye AlkenBailey A Malala Trenkamp 10/07/2016, 1:27 PM

## 2016-10-07 NOTE — Discharge Summary (Signed)
Physician Discharge Summary  Patient ID: Kenneth Thompson MRN: [id number] DOB/AGE: 66/08/1950 66 y.o.  Admit date: 10/06/2016 Discharge date: 10/07/2016  Admission Diagnoses:  Discharge Diagnoses:  Active Problems:   Lumbar radiculopathy   Discharged Condition: good  Hospital Course: Patient mid-to the hospital where he underwent an uncomplicated two-level lumbar decompression and fusion. Postoperative patient is doing reasonably well. Up ambulating with minimal difficulty. Preoperative back and lower extremity pain improving. Patient feels ready for discharge home.  Consults:   Significant Diagnostic Studies:   Treatments:   Discharge Exam: Blood pressure 136/79, pulse 68, temperature 98.1 F (36.7 C), temperature source Oral, resp. rate 17, height 6\' 4"  (1.93 m), weight 124.3 kg (274 lb), SpO2 98 %. Awake and alert. Oriented and appropriate. Cranial nerve function intact. Motor sensory function extremities normal. Wound clean and dry. Chest and abdomen benign.  Disposition: 01-Home or Self Care   Allergies as of 10/07/2016   No Known Allergies     Medication List    TAKE these medications   aspirin 81 MG tablet Take 81 mg by mouth daily.   atenolol 50 MG tablet Commonly known as:  TENORMIN Take 50 mg by mouth daily.   baclofen 10 MG tablet Commonly known as:  LIORESAL Take 3 tablets (30 mg total) by mouth 3 (three) times daily.   gabapentin 600 MG tablet Commonly known as:  NEURONTIN Take 2 tablets (1,200 mg total) by mouth 4 (four) times daily.   gemfibrozil 600 MG tablet Commonly known as:  LOPID Take 600 mg by mouth 2 (two) times daily.   glimepiride 4 MG tablet Commonly known as:  AMARYL TAKE 1 TAB BY MOUTH ONCE DAILY WITH BREAKFAST OR FIRST MAIN MEAL OF THE DAY   HYDROcodone-acetaminophen 5-325 MG tablet Commonly known as:  NORCO/VICODIN Take 0.5-1 tablets by mouth daily as needed for moderate pain or severe pain.   losartan 100 MG tablet Commonly  known as:  COZAAR Take 100 mg by mouth daily.   metFORMIN 500 MG tablet Commonly known as:  GLUCOPHAGE Take 1,000 mg by mouth 2 (two) times daily.   ONE TOUCH ULTRA TEST test strip Generic drug:  glucose blood   OXcarbazepine ER 150 MG Tb24 Commonly known as:  OXTELLAR XR Take 450 mg by mouth 2 (two) times daily.   oxyCODONE-acetaminophen 5-325 MG tablet Commonly known as:  PERCOCET/ROXICET Take 0.5 tablets by mouth 2 (two) times daily as needed for severe pain. What changed:  Another medication with the same name was added. Make sure you understand how and when to take each.   oxyCODONE-acetaminophen 10-325 MG tablet Commonly known as:  PERCOCET Take 1-2 tablets by mouth every 4 (four) hours as needed for pain. What changed:  You were already taking a medication with the same name, and this prescription was added. Make sure you understand how and when to take each.      Follow-up Information    Lisbeth RenshawNundkumar, Neelesh, MD Follow up.   Specialty:  Neurosurgery Contact information: [address] [place] [address] [phone]           Signed: Temple PaciniOOL,Jawad Wiacek A 10/07/2016, 9:06 AM

## 2016-10-07 NOTE — Progress Notes (Signed)
Patient is discharged from room 3C09 at this time. Alert and in stable condition. IV site d/c'd and instructions read to patient and husband with understanding verbalized. Left unit via wheelchair with all belongings at side. 

## 2016-10-09 ENCOUNTER — Encounter (HOSPITAL_COMMUNITY): Payer: Self-pay | Admitting: Neurosurgery

## 2016-10-11 ENCOUNTER — Observation Stay (HOSPITAL_COMMUNITY)
Admission: EM | Admit: 2016-10-11 | Discharge: 2016-10-12 | Disposition: A | Discharge status: Home / Self Care | Payer: Commercial Managed Care - PPO | Attending: Family Medicine | Admitting: Family Medicine

## 2016-10-11 ENCOUNTER — Encounter (HOSPITAL_COMMUNITY): Payer: Self-pay

## 2016-10-11 DIAGNOSIS — E162 Hypoglycemia, unspecified: Secondary | ICD-10-CM

## 2016-10-11 DIAGNOSIS — M4726 Other spondylosis with radiculopathy, lumbar region: Secondary | ICD-10-CM | POA: Insufficient documentation

## 2016-10-11 DIAGNOSIS — E11649 Type 2 diabetes mellitus with hypoglycemia without coma: Secondary | ICD-10-CM | POA: Insufficient documentation

## 2016-10-11 DIAGNOSIS — F17210 Nicotine dependence, cigarettes, uncomplicated: Secondary | ICD-10-CM | POA: Insufficient documentation

## 2016-10-11 DIAGNOSIS — F419 Anxiety disorder, unspecified: Secondary | ICD-10-CM | POA: Diagnosis not present

## 2016-10-11 DIAGNOSIS — T383X5A Adverse effect of insulin and oral hypoglycemic [antidiabetic] drugs, initial encounter: Secondary | ICD-10-CM

## 2016-10-11 DIAGNOSIS — I10 Essential (primary) hypertension: Secondary | ICD-10-CM | POA: Diagnosis not present

## 2016-10-11 DIAGNOSIS — G8929 Other chronic pain: Principal | ICD-10-CM | POA: Insufficient documentation

## 2016-10-11 DIAGNOSIS — E785 Hyperlipidemia, unspecified: Secondary | ICD-10-CM | POA: Diagnosis not present

## 2016-10-11 DIAGNOSIS — Z7984 Long term (current) use of oral hypoglycemic drugs: Secondary | ICD-10-CM | POA: Insufficient documentation

## 2016-10-11 DIAGNOSIS — Z79899 Other long term (current) drug therapy: Secondary | ICD-10-CM | POA: Insufficient documentation

## 2016-10-11 DIAGNOSIS — G4733 Obstructive sleep apnea (adult) (pediatric): Secondary | ICD-10-CM | POA: Insufficient documentation

## 2016-10-11 DIAGNOSIS — Z7982 Long term (current) use of aspirin: Secondary | ICD-10-CM | POA: Diagnosis not present

## 2016-10-11 DIAGNOSIS — Z6833 Body mass index (BMI) 33.0-33.9, adult: Secondary | ICD-10-CM | POA: Insufficient documentation

## 2016-10-11 DIAGNOSIS — E669 Obesity, unspecified: Secondary | ICD-10-CM | POA: Diagnosis not present

## 2016-10-11 LAB — BASIC METABOLIC PANEL
ANION GAP: 11 (ref 5–15)
BUN: 13 mg/dL (ref 6–20)
CHLORIDE: 96 mmol/L — AB (ref 101–111)
CO2: 23 mmol/L (ref 22–32)
Calcium: 9.2 mg/dL (ref 8.9–10.3)
Creatinine, Ser: 0.74 mg/dL (ref 0.61–1.24)
GFR calc non Af Amer: >60 mL/min (ref >60)
Glucose, Bld: 53 mg/dL — ABNORMAL LOW (ref 65–99)
POTASSIUM: 3.7 mmol/L (ref 3.5–5.1)
SODIUM: 130 mmol/L — AB (ref 135–145)

## 2016-10-11 LAB — CBC WITH DIFFERENTIAL/PLATELET
BASOS ABS: 0 10*3/uL (ref 0.0–0.1)
BASOS PCT: 0 %
EOS ABS: 0.1 10*3/uL (ref 0.0–0.7)
Eosinophils Relative: 1 %
HEMATOCRIT: 32.6 % — AB (ref 39.0–52.0)
HEMOGLOBIN: 11.2 g/dL — AB (ref 13.0–17.0)
LYMPHS ABS: 1.1 10*3/uL (ref 0.7–4.0)
Lymphocytes Relative: 12 %
MCH: 30.9 pg (ref 26.0–34.0)
MCHC: 34.4 g/dL (ref 30.0–36.0)
MCV: 90.1 fL (ref 78.0–100.0)
Monocytes Absolute: 0.8 10*3/uL (ref 0.1–1.0)
Monocytes Relative: 9 %
NEUTROS ABS: 7 10*3/uL (ref 1.7–7.7)
Neutrophils Relative %: 78 %
Platelets: ADEQUATE 10*3/uL (ref 150–400)
RBC: 3.62 MIL/uL — ABNORMAL LOW (ref 4.22–5.81)
RDW: 12.6 % (ref 11.5–15.5)
Smear Review: ADEQUATE
WBC: 9 10*3/uL (ref 4.0–10.5)

## 2016-10-11 LAB — CBG MONITORING, ED
GLUCOSE-CAPILLARY: 41 mg/dL — AB (ref 65–99)
GLUCOSE-CAPILLARY: 105 mg/dL — AB (ref 65–99)
Glucose-Capillary: 72 mg/dL (ref 65–99)
Glucose-Capillary: 102 mg/dL — ABNORMAL HIGH (ref 65–99)

## 2016-10-11 MED ORDER — ASPIRIN EC 81 MG PO TBEC
81.0000 mg | DELAYED_RELEASE_TABLET | Freq: Every day | ORAL | Status: DC
  Administered 2016-10-12: 81 mg via ORAL
  Filled 2016-10-11: qty 1

## 2016-10-11 MED ORDER — DEXTROSE 50 % IV SOLN
1.0000 | Freq: Once | INTRAVENOUS | Status: AC
  Administered 2016-10-11: 50 mL via INTRAVENOUS

## 2016-10-11 MED ORDER — LOSARTAN POTASSIUM 50 MG PO TABS
100.0000 mg | ORAL_TABLET | Freq: Every day | ORAL | Status: DC
  Administered 2016-10-12: 100 mg via ORAL
  Filled 2016-10-11: qty 2

## 2016-10-11 MED ORDER — ENOXAPARIN SODIUM 30 MG/0.3ML ~~LOC~~ SOLN
30.0000 mg | SUBCUTANEOUS | Status: DC
  Administered 2016-10-11: 30 mg via SUBCUTANEOUS
  Filled 2016-10-11: qty 0.3

## 2016-10-11 MED ORDER — DEXTROSE-NACL 5-0.45 % IV SOLN: INTRAVENOUS | Status: DC

## 2016-10-11 MED ORDER — KETOROLAC TROMETHAMINE 15 MG/ML IJ SOLN
15.0000 mg | Freq: Once | INTRAMUSCULAR | Status: AC
  Administered 2016-10-11: 15 mg via INTRAVENOUS
  Filled 2016-10-11: qty 1

## 2016-10-11 MED ORDER — ATENOLOL 50 MG PO TABS
50.0000 mg | ORAL_TABLET | Freq: Every day | ORAL | Status: DC
  Administered 2016-10-12: 50 mg via ORAL
  Filled 2016-10-11: qty 1

## 2016-10-11 MED ORDER — GABAPENTIN 600 MG PO TABS
1200.0000 mg | ORAL_TABLET | Freq: Four times a day (QID) | ORAL | Status: DC
  Administered 2016-10-11 – 2016-10-12 (×2): 1200 mg via ORAL
  Filled 2016-10-11 (×2): qty 2

## 2016-10-11 MED ORDER — BACLOFEN 10 MG PO TABS
30.0000 mg | ORAL_TABLET | Freq: Three times a day (TID) | ORAL | Status: DC
  Administered 2016-10-11 – 2016-10-12 (×2): 30 mg via ORAL
  Filled 2016-10-11 (×2): qty 3

## 2016-10-11 MED ORDER — OXYCODONE-ACETAMINOPHEN 5-325 MG PO TABS
0.5000 | ORAL_TABLET | Freq: Two times a day (BID) | ORAL | Status: DC | PRN
  Administered 2016-10-12: 0.5 via ORAL
  Filled 2016-10-11: qty 1

## 2016-10-11 MED ORDER — DEXTROSE 10 % IV SOLN
INTRAVENOUS | Status: DC
Start: 2016-10-11 — End: 2016-10-12
  Administered 2016-10-11 – 2016-10-12 (×2): via INTRAVENOUS

## 2016-10-11 MED ORDER — OXCARBAZEPINE ER 150 MG PO TB24
450.0000 mg | ORAL_TABLET | Freq: Two times a day (BID) | ORAL | Status: DC
  Administered 2016-10-11: 450 mg via ORAL
  Filled 2016-10-11 (×4): qty 3

## 2016-10-11 MED ORDER — MORPHINE SULFATE (PF) 4 MG/ML IV SOLN
4.0000 mg | INTRAVENOUS | Status: AC | PRN
  Administered 2016-10-11 – 2016-10-12 (×3): 4 mg via INTRAVENOUS
  Filled 2016-10-11 (×3): qty 1

## 2016-10-11 MED ORDER — DEXTROSE 50 % IV SOLN
INTRAVENOUS | Status: AC
  Filled 2016-10-11: qty 50

## 2016-10-11 MED ORDER — DEXTROSE-NACL 5-0.45 % IV SOLN
INTRAVENOUS | Status: DC
  Administered 2016-10-11: 18:00:00 via INTRAVENOUS

## 2016-10-11 NOTE — ED Provider Notes (Signed)
[facility]-EMERGENCY DEPT Provider Note   CSN: [id number] Arrival date & time:        History   Chief Complaint Chief Complaint  Patient presents with  . Hypoglycemia    HPI Kenneth Thompson is a 66 y.o. male.  Patient is a diabetic. He takes a sulfonylurea, glimepiride. Reports taking his medications around 3 AM. Around 6:30 AM, had first episode of blurred vision, nausea. Fingerstick was in the 60s. Was able to eat with improvement in fingerstick. Throughout the day today, the patient is had multiple episodes of hypoglycemia. EMS was called. With EMS, the patient received D50 with initial improvement in blood sugar, with return of hypoglycemia, and then was started on a dextrose infusion. On arrival here, the patient is asymptomatic. Denies any fevers or chills. No infectious symptoms.   The history is provided by the patient.  Illness  This is a new problem. The current episode started 6 to 12 hours ago. The problem occurs constantly. Progression since onset: waxing and waning. Pertinent negatives include no chest pain, no abdominal pain and no shortness of breath. Relieved by: eating.    Past Medical History:  Diagnosis Date  . Anxiety   . Arthritis   . Diabetes (HCC)   . High cholesterol   . Neuropathic pain of lower extremity   . Sleep apnea    CPAP     over 5 years ago   does not know where done    Patient Active Problem List   Diagnosis Date Noted  . Hypoglycemia 10/11/2016  . Lumbar radiculopathy 10/06/2016  . 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 Disturbance of skin sensation 01/15/2013  . Abnormality of gait 01/15/2013    Past Surgical History:  Procedure Laterality Date  . APPLICATION OF ROBOTIC ASSISTANCE FOR SPINAL PROCEDURE N/A 10/06/2016   Procedure: APPLICATION OF ROBOTIC ASSISTANCE FOR SPINAL PROCEDURE;  Surgeon: Lisbeth Renshaw, MD;  Location: [facility] OR;  Service: Neurosurgery;  Laterality: N/A;  . Back  Surgeries  2012   Total 3 back sirgeries  . BACK SURGERY     Spinal AV fistula  . EYE SURGERY Bilateral 2017   cataracts removed  . Right foot/ ankle reconstruction         Home Medications    Prior to Admission medications   Medication Sig Start Date End Date Taking? Authorizing Provider  atenolol (TENORMIN) 50 MG tablet Take 50 mg by mouth daily. 05/01/16  Yes [provider]  baclofen (LIORESAL) 10 MG tablet Take 3 tablets (30 mg total) by mouth 3 (three) times daily. 05/25/16  Yes Levert Feinstein, MD  gabapentin (NEURONTIN) 600 MG tablet Take 2 tablets (1,200 mg total) by mouth 4 (four) times daily. 08/30/16  Yes Levert Feinstein, MD  gemfibrozil (LOPID) 600 MG tablet Take 600 mg by mouth 2 (two) times daily.  01/10/13  Yes [provider]  losartan (COZAAR) 100 MG tablet Take 100 mg by mouth daily.  01/05/13  Yes [provider]  metFORMIN (GLUCOPHAGE) 500 MG tablet Take 1,000 mg by mouth 2 (two) times daily.    Yes [provider]  OXcarbazepine ER (OXTELLAR XR) 150 MG TB24 Take 450 mg by mouth 2 (two) times daily. 05/25/16  Yes Levert Feinstein, MD  oxyCODONE-acetaminophen (PERCOCET/ROXICET) 5-325 MG tablet Take 0.5 tablets by mouth 2 (two) times daily as needed for severe pain.   Yes [provider]  aspirin 81 MG tablet Take 81 mg by mouth daily.  [provider]  glimepiride (AMARYL) 4 MG tablet TAKE 1 TAB BY MOUTH ONCE DAILY WITH BREAKFAST OR FIRST MAIN MEAL OF THE DAY 11/26/14   [provider]  ONE TOUCH ULTRA TEST test strip  01/05/13   [provider]    Family History Family History  Problem Relation Age of Onset  . Cancer Mother   . Cancer Father     Social History Social History  Substance Use Topics  . Smoking status: Current Every Day Smoker    Packs/day: 0.25    Years: 35.00  . Smokeless tobacco: Never Used  . Alcohol use No     Comment: quit 4-5 months ago     Allergies   Patient has no known  allergies.   Review of Systems Review of Systems  Constitutional: Positive for diaphoresis. Negative for chills and fever.  Eyes: Positive for visual disturbance (blurred).  Respiratory: Negative for cough and shortness of breath.   Cardiovascular: Negative for chest pain.  Gastrointestinal: Negative for abdominal pain, blood in stool, diarrhea, nausea and vomiting.  Neurological: Positive for light-headedness.  All other systems reviewed and are negative.    Physical Exam Updated Vital Signs BP (!) 154/85 (BP Location: Right Arm)   Pulse (!) 57   Temp 98.3 F (36.8 C) (Oral)   Resp 15   SpO2 99%   Physical Exam  Constitutional: He appears well-developed and well-nourished.  HENT:  Head: Normocephalic and atraumatic.  Eyes: Conjunctivae are normal.  Neck: Neck supple.  Cardiovascular: Normal rate and regular rhythm.   No murmur heard. Pulmonary/Chest: Effort normal and breath sounds normal. No respiratory distress.  Abdominal: Soft. There is no tenderness.  Musculoskeletal: He exhibits no edema.  Neurological: He is alert.  Skin: Skin is warm and dry.  Psychiatric: He has a normal mood and affect.  Nursing note and vitals reviewed.    ED Treatments / Results  Labs (all labs ordered are listed, but only abnormal results are displayed) Labs Reviewed  BASIC METABOLIC PANEL - Abnormal; Notable for the following:       Result Value   Sodium 130 (*)    Chloride 96 (*)    Glucose, Bld 53 (*)    All other components within normal limits  CBC WITH DIFFERENTIAL/PLATELET - Abnormal; Notable for the following:    RBC 3.62 (*)    Hemoglobin 11.2 (*)    HCT 32.6 (*)    All other components within normal limits  CBG MONITORING, ED - Abnormal; Notable for the following:    Glucose-Capillary 41 (*)    All other components within normal limits  CBG MONITORING, ED - Abnormal; Notable for the following:    Glucose-Capillary 102 (*)    All other components within normal  limits  CBG MONITORING, ED - Abnormal; Notable for the following:    Glucose-Capillary 105 (*)    All other components within normal limits  COMPREHENSIVE METABOLIC PANEL  CBG MONITORING, ED    EKG  EKG Interpretation  Date/Time:  Wednesday Oct 11 2016 17:35:55 EDT Ventricular Rate:  66 PR Interval:    QRS Duration: 105 QT Interval:  407 QTC Calculation: 427 R Axis:   36 Text Interpretation:  Sinus rhythm Abnormal R-wave progression, early transition No significant change since last tracing Confirmed by Jacalyn Lefevre [phone]) on 10/11/2016 5:47:03 PM       Radiology No results found.  Procedures Procedures (including critical care time)  Medications Ordered in ED Medications  morphine  4 MG/ML injection 4 mg (4 mg Intravenous Given 10/11/16 1838)  dextrose 10 % infusion ( Intravenous New Bag/Given 10/11/16 1810)  enoxaparin (LOVENOX) injection 30 mg (30 mg Subcutaneous Given by Other 10/11/16 2108)  oxyCODONE-acetaminophen (PERCOCET/ROXICET) 5-325 MG per tablet 0.5 tablet (not administered)  gabapentin (NEURONTIN) tablet 1,200 mg (1,200 mg Oral Given by Other 10/11/16 2108)  baclofen (LIORESAL) tablet 30 mg (30 mg Oral Given by Other 10/11/16 2108)  OXcarbazepine ER TB24 450 mg (not administered)  atenolol (TENORMIN) tablet 50 mg (not administered)  losartan (COZAAR) tablet 100 mg (not administered)  aspirin EC tablet 81 mg (not administered)  ketorolac (TORADOL) 15 MG/ML injection 15 mg (15 mg Intravenous Given 10/11/16 1802)  dextrose 50 % solution 50 mL (50 mLs Intravenous Given 10/11/16 1810)     Initial Impression / Assessment and Plan / ED Course  I have reviewed the triage vital signs and the nursing notes.  Pertinent labs & imaging results that were available during my care of the patient were reviewed by me and considered in my medical decision making (see chart for details).     Patient is on a sulfonylurea, likely causing the patient's hypoglycemic episodes.  Has required multiple boluses of dextrose from both EMS as well as here in the emergency department. This medication is long acting. We'll admit the patient to the hospital for close monitoring. We'll continue the patient on dextrose infusion and continue every one hour fingersticks. He denies any fevers or chills. Vital signs here are normal and stable. Doubt infectious process that could be causing this. No other new medication changes except for pain medications that he has been taking for a recent spinal surgery.  Final Clinical Impressions(s) / ED Diagnoses   Final diagnoses:  Hypoglycemia  Adverse effect of sulfonylurea, initial encounter    New Prescriptions Current Discharge Medication List       Lindalou Hose'Rourke, Komal Stangelo, MD 10/11/16 2151    Jacalyn LefevreHaviland, Julie, MD 10/11/16 2154

## 2016-10-11 NOTE — ED Notes (Signed)
Attempted report x 1; name and call back number provided 

## 2016-10-11 NOTE — ED Triage Notes (Signed)
Pt presents via [facility] for evaluation of hypoglycemia. Initially responded for possible overdose, reports back sx Friday. States CBG 34 on arrival, given 1 amp D50 in route. States CBG up to 175 but dropped shortly after to 68. Given 250 ml D10 in route. Alert and oriented x 4.

## 2016-10-11 NOTE — H&P (Signed)
Kenneth KohutLarry F Thompson ZOX:[id number] DOB: 03/31/1951 DOA: 10/11/2016  Referring physician: Carolan Clines'rourke in ED PCP: Maurice SmallGriffin, Elaine, MD  Specialists: none  Chief Complaint: hypo  HPI: Kenneth Thompson is a 66 y.o. male  Dm ty II since 2008 [facility]  Dr. Arlyss QueenZomorodi History of obstructive sleep apnea on CPAP Obese Chronic low back pain Hyperlipidemia  Recent admission 5/18-5/19 for him, daily 2 level lumbar decompression and fusion under Dr.nandkumar  He went home and has not been eating as much as usual needed History of October regimen is to take his glipizide and metformin in the morning around 3 or 4 AM and watch TV as he has been conditioned to do this since he was involved in clinical trials 2-3 years ago. He has not been recently involved in any clinical trials His typical sugars running from 60-100 without any way to issue in the mornings and he has not really experienced any significant nose He has lost some weight prior to the back surgery  An IV was at he was found around 4:30 or 5 talking out of his head and not making much sense and throughout the day his sugars have ranged in the 40-50 range and finally around 4 PM family noticed the patient was nonresponsive and noncoherent and talking nonsensically EMS was called and patient was given an amp of D50 which brought him back around however then his sugar dropped and he was transported to the emergency room and given another dose of D50 and her sugars currently come up to 100 He currently is oriented and able to eat   Review of Systems: The patient denies  Pager Anorexia Nausea Vomiting Constitutional symptoms Profuse sweating + +  double vision that time that the sugars dropped   Past Medical History:  Diagnosis Date  . Anxiety   . Arthritis   . Diabetes (HCC)   . High cholesterol   . Neuropathic pain of lower extremity   . Sleep apnea    CPAP     over 5 years ago   does not know where done    Past Surgical History:  Procedure Laterality Date  . APPLICATION OF ROBOTIC ASSISTANCE FOR SPINAL PROCEDURE N/A 10/06/2016   Procedure: APPLICATION OF ROBOTIC ASSISTANCE FOR SPINAL PROCEDURE;  Surgeon: Lisbeth RenshawNundkumar, Neelesh, MD;  Location: [facility] OR;  Service: Neurosurgery;  Laterality: N/A;  . Back Surgeries  2012   Total 3 back sirgeries  . BACK SURGERY     Spinal AV fistula  . EYE SURGERY Bilateral 2017   cataracts removed  . Right foot/ ankle reconstruction     Social History:  reports that he has been smoking.  He has a 8.75 pack-year smoking history. He has never used smokeless tobacco. He reports that he does not drink alcohol or use drugs. \prior heavy drinker 1/2 pint [place] mist till 4 mo ago No drugs Used to work in [employer]  Retired 2010 lives with wife  No Known Allergies  Family History  Problem Relation Age of Onset  . Cancer Mother   . Cancer Father    Prior to Admission medications   Medication Sig Start Date End Date Taking? Authorizing Provider  aspirin 81 MG tablet Take 81 mg by mouth daily.    [provider]  atenolol (TENORMIN) 50 MG tablet Take 50 mg by mouth daily. 05/01/16   [provider]  baclofen (LIORESAL) 10 MG tablet Take 3 tablets (30 mg total) by mouth  3 (three) times daily. 05/25/16   Levert Feinstein, MD  gabapentin (NEURONTIN) 600 MG tablet Take 2 tablets (1,200 mg total) by mouth 4 (four) times daily. 08/30/16   Levert Feinstein, MD  gemfibrozil (LOPID) 600 MG tablet Take 600 mg by mouth 2 (two) times daily.  01/10/13   [provider]  glimepiride (AMARYL) 4 MG tablet TAKE 1 TAB BY MOUTH ONCE DAILY WITH BREAKFAST OR FIRST MAIN MEAL OF THE DAY 11/26/14   [provider]  HYDROcodone-acetaminophen (NORCO/VICODIN) 5-325 MG per tablet Take 0.5-1 tablets by mouth daily as needed for moderate pain or severe pain.  08/17/14   [provider]  losartan (COZAAR) 100 MG tablet Take 100 mg by mouth daily.  01/05/13   [provider]  metFORMIN (GLUCOPHAGE) 500 MG tablet Take 1,000 mg by mouth 2 (two) times daily.     [provider]  ONE TOUCH ULTRA TEST test strip  01/05/13   [provider]  OXcarbazepine ER (OXTELLAR XR) 150 MG TB24 Take 450 mg by mouth 2 (two) times daily. 05/25/16   Levert Feinstein, MD  oxyCODONE-acetaminophen (PERCOCET) 10-325 MG tablet Take 1-2 tablets by mouth every 4 (four) hours as needed for pain. 10/07/16   Julio Sicks, MD  oxyCODONE-acetaminophen (PERCOCET/ROXICET) 5-325 MG tablet Take 0.5 tablets by mouth 2 (two) times daily as needed for severe pain.    [provider]   Physical Exam: Vitals:   10/11/16 1800 10/11/16 1815  BP: 136/77 (!) 151/77  Pulse: 61 63  Resp: 12 12    Obese pleasant oriented in nad No jvd, no bruit s1 s2 rrr, no m No rales or rhonchi Lower back has slight blood tinged bandage on lower back SLRT is neg and not in muhc pain as received some pain meds     Labs on Admission:  Basic Metabolic Panel:  Recent Labs Lab 10/06/16 1215 10/06/16 1337 10/07/16 0449 10/11/16 1747  NA 134* 134* 135 130*  K 5.1 4.7 4.6 3.7  CL  --   --  100* 96*  CO2  --   --  26 23  GLUCOSE 141* 153* 127* 53*  BUN  --   --  13 13  CREATININE  --   --  0.81 0.74  CALCIUM  --   --  8.8* 9.2   Liver Function Tests: No results for input(s): AST, ALT, ALKPHOS, BILITOT, PROT, ALBUMIN in the last 168 hours. No results for input(s): LIPASE, AMYLASE in the last 168 hours. No results for input(s): AMMONIA in the last 168 hours. CBC:  Recent Labs Lab 10/06/16 1215 10/06/16 1337 10/07/16 0449 10/11/16 1747  WBC  --   --  11.6* PENDING  NEUTROABS  --   --   --  PENDING  HGB 13.6 11.6* 10.6* 11.2*  HCT 40.0 34.0* 32.1* 32.6*  MCV  --   --  90.4 90.1  PLT  --   --  184 PENDING   Cardiac Enzymes: No results for input(s): CKTOTAL, CKMB, CKMBINDEX, TROPONINI in the last 168 hours.  BNP (last 3 results) No results for input(s): BNP in the  last 8760 hours.  ProBNP (last 3 results) No results for input(s): PROBNP in the last 8760 hours.  CBG:  Recent Labs Lab 10/06/16 2122 10/07/16 0106 10/11/16 1725 10/11/16 1806 10/11/16 1844  GLUCAP 182* 118* 72 41* 102*    Radiological Exams on Admission: No results found.  EKG: Independently reviewed. Nsr, pr 0.08 qrs axis 45,  no stt changes  Assessment/Plan Active Problems:   * No active hospital problems. *  profound hypoglycemia-stop all sulpohnylurea Can restart metformin in am Keep on D10 as per Ed orders for now q 1 hr checks for cbg Admit to sdu Nursing to call MD on call and change to q2 checks once 2 consecutive readings above 120  htn Cont atenolol 50 qd, losartan 100 qd controlled  OSa-place on cpap at night per autopap and RT  Chr Low back pain s/p recent surgery-will need OP follow-up Cont home oxycodone , baclofen Gabapentin   obs on sdu Full code D/w family  Time spent: 49  Rhetta Mura [facility] Pager [phone]  If 7PM-7AM, please contact night-coverage [email or web address] Password [facility] 10/11/2016, 6:57 PM

## 2016-10-12 DIAGNOSIS — E162 Hypoglycemia, unspecified: Secondary | ICD-10-CM | POA: Diagnosis not present

## 2016-10-12 LAB — COMPREHENSIVE METABOLIC PANEL
ALBUMIN: 3 g/dL — AB (ref 3.5–5.0)
ALT: 22 U/L (ref 17–63)
ANION GAP: 7 (ref 5–15)
AST: 30 U/L (ref 15–41)
Alkaline Phosphatase: 49 U/L (ref 38–126)
BUN: 14 mg/dL (ref 6–20)
CHLORIDE: 97 mmol/L — AB (ref 101–111)
CO2: 25 mmol/L (ref 22–32)
Calcium: 8.5 mg/dL — ABNORMAL LOW (ref 8.9–10.3)
Creatinine, Ser: 0.72 mg/dL (ref 0.61–1.24)
GFR calc Af Amer: >60 mL/min (ref >60)
GFR calc non Af Amer: >60 mL/min (ref >60)
GLUCOSE: 132 mg/dL — AB (ref 65–99)
POTASSIUM: 3.9 mmol/L (ref 3.5–5.1)
SODIUM: 129 mmol/L — AB (ref 135–145)
Total Bilirubin: 0.4 mg/dL (ref 0.3–1.2)
Total Protein: 6 g/dL — ABNORMAL LOW (ref 6.5–8.1)

## 2016-10-12 LAB — GLUCOSE, CAPILLARY
GLUCOSE-CAPILLARY: 171 mg/dL — AB (ref 65–99)
GLUCOSE-CAPILLARY: 136 mg/dL — AB (ref 65–99)
GLUCOSE-CAPILLARY: 154 mg/dL — AB (ref 65–99)
Glucose-Capillary: 104 mg/dL — ABNORMAL HIGH (ref 65–99)
Glucose-Capillary: 139 mg/dL — ABNORMAL HIGH (ref 65–99)
Glucose-Capillary: 135 mg/dL — ABNORMAL HIGH (ref 65–99)
Glucose-Capillary: 150 mg/dL — ABNORMAL HIGH (ref 65–99)
Glucose-Capillary: 150 mg/dL — ABNORMAL HIGH (ref 65–99)
Glucose-Capillary: 164 mg/dL — ABNORMAL HIGH (ref 65–99)

## 2016-10-12 MED ORDER — GLUCOSE BLOOD VI STRP: ORAL_STRIP | 12 refills | Status: AC

## 2016-10-12 MED ORDER — OXCARBAZEPINE ER 150 MG PO TB24
450.0000 mg | ORAL_TABLET | Freq: Two times a day (BID) | ORAL | Status: DC
  Administered 2016-10-12: 450 mg via ORAL
  Filled 2016-10-12 (×3): qty 3

## 2016-10-12 MED ORDER — MORPHINE SULFATE (PF) 4 MG/ML IV SOLN
4.0000 mg | INTRAVENOUS | Status: DC | PRN
Start: 2016-10-12 — End: 2016-10-12
  Administered 2016-10-12 (×2): 4 mg via INTRAVENOUS
  Filled 2016-10-12 (×2): qty 1

## 2016-10-12 MED ORDER — CALCIUM CARBONATE ANTACID 500 MG PO CHEW
1.0000 | CHEWABLE_TABLET | Freq: Two times a day (BID) | ORAL | Status: DC
  Administered 2016-10-12: 200 mg via ORAL
  Filled 2016-10-12: qty 1

## 2016-10-12 MED ORDER — ENOXAPARIN SODIUM 40 MG/0.4ML ~~LOC~~ SOLN: 40.0000 mg | SUBCUTANEOUS | Status: DC

## 2016-10-12 NOTE — Care Management Note (Signed)
Case Management Note  Patient Details  Name: Donzetta KohutLarry F Desena MRN: [id number] Date of Birth: 09/06/1950  Subjective/Objective:   From home ,presents with hypoglycemia, for dc today, no needs.  PCP listed is Maurice SmallElaine Griffin                 Action/Plan: NCM will follow for dc needs.  Expected Discharge Date:  10/12/16               Expected Discharge Plan:  Home/Self Care  In-House Referral:     Discharge planning Services  CM Consult  Post Acute Care Choice:    Choice offered to:     DME Arranged:    DME Agency:     HH Arranged:    HH Agency:     Status of Service:  Completed, signed off  If discussed at [employer] Length of Stay Meetings, dates discussed:    Additional Comments:  Leone Havenaylor, Kendarious Gudino Clinton, RN 10/12/2016, 12:16 PM

## 2016-10-12 NOTE — Discharge Summary (Signed)
Physician Discharge Summary  ROBERTT BUDA ZOX:[id number] DOB: Oct 20, 1950 DOA: 10/11/2016  PCP: [name], MD  Admit date: 10/11/2016 Discharge date: 10/12/2016  Time spent: 30 minutes  Recommendations for Outpatient Follow-up:  1. Stop amaryl, may need to re-start the same in the future depending on diet and home CBG's 2. Follow up as indicated with Dr. Rolanda Jay 3. Continue chronic meds--no pain meds given on d/c  Discharge Diagnoses:  Active Problems:   Hypoglycemia   Discharge Condition: good  Diet recommendation: DM ty2 diet  Filed Weights   10/11/16 2050  Weight: 124.1 kg (273 lb 9.5 oz)   Hospital Course:  LEONARDO MAKRIS is a 66 y.o. 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 Arlyss Queen History of obstructive sleep apnea on CPAP Obese Chronic low back pain Hyperlipidemia  Recent admission 5/18-5/19 for him, daily 2 level lumbar decompression and fusion under Dr. Rolanda Jay  He was hypoglycemic ove the course of 5/23 but this resolved while placing him on d10 His sugars stayed above 150 and his d10 was d/c He was placed on a reg diet and checked his sugars qid achs Dr. Rolanda Jay seredipitiously stopped by and saw the patient and removed his back dressing and will follow the patient as an OP as indicated--he is safe to resume asa 48 mg-I will forward this note He is stable for d/c home Refilled his glucose strips  Discharge Exam: Vitals:   10/12/16 0754 10/12/16 0805  BP: 135/87   Pulse: 61 63  Resp: 16 16  Temp: 98.1 F (36.7 C)     General: eomi ncat Cardiovascular: s1 s 2no m/r/g Respiratory: clear Back wound clean with mild hematoma upper area otherwise well healed  Discharge Instructions   Discharge Instructions    Diet - low sodium heart healthy    Complete by:  As directed    Discharge instructions    Complete by:  As directed    No glimepiride at all-can use metformon Follow with Dr. Rolanda Jay as op as  needed May resume aspirin 81 daily   Increase activity slowly    Complete by:  As directed      Current Discharge Medication List    CONTINUE these medications which have CHANGED   Details  glucose blood (ONE TOUCH ULTRA TEST) test strip Check cbgs 4 times a day Qty: 100 each, Refills: 12      CONTINUE these medications which have NOT CHANGED   Details  atenolol (TENORMIN) 50 MG tablet Take 50 mg by mouth daily. Refills: 11    baclofen (LIORESAL) 10 MG tablet Take 3 tablets (30 mg total) by mouth 3 (three) times daily. Qty: 270 tablet, Refills: 11    gabapentin (NEURONTIN) 600 MG tablet Take 2 tablets (1,200 mg total) by mouth 4 (four) times daily. Qty: 240 tablet, Refills: 11    gemfibrozil (LOPID) 600 MG tablet Take 600 mg by mouth 2 (two) times daily.     losartan (COZAAR) 100 MG tablet Take 100 mg by mouth daily.     metFORMIN (GLUCOPHAGE) 500 MG tablet Take 1,000 mg by mouth 2 (two) times daily.     OXcarbazepine ER (OXTELLAR XR) 150 MG TB24 Take 450 mg by mouth 2 (two) times daily. Qty: 540 tablet, Refills: 4    oxyCODONE-acetaminophen (PERCOCET/ROXICET) 5-325 MG tablet Take 0.5 tablets by mouth 2 (two) times daily as needed for severe pain.    aspirin 81 MG tablet Take 81 mg  by mouth daily.      STOP taking these medications     glimepiride (AMARYL) 4 MG tablet        No Known Allergies    The results of significant diagnostics from this hospitalization (including imaging, microbiology, ancillary and laboratory) are listed below for reference.    Significant Diagnostic Studies: Dg Lumbar Spine 2-3 Views  Result Date: 10/06/2016 CLINICAL DATA:  L4-5 and L5-S1 PLIF EXAM: DG C-ARM GT 120 MIN; LUMBAR SPINE - 2-3 VIEW COMPARISON:  09/18/2016 FLUOROSCOPY TIME:  Radiation Exposure Index (as provided by the fluoroscopic device): Not available If the device does not provide the exposure index: Fluoroscopy Time:  1 minutes 5 seconds Number of Acquired Images:  2  FINDINGS: Pedicle screws are noted at L4-L5 and S1 with interbody fusion at both levels. Posterior fixation elements are noted. IMPRESSION: L4-5 and L5-S1 PLIF Electronically Signed   By: Alcide Clever M.D.   On: 10/06/2016 14:23   Ct Lumbar Spine Wo Contrast  Result Date: 09/18/2016 CLINICAL DATA:  Spondylolisthesis.  Low back pain. EXAM: CT LUMBAR SPINE WITHOUT CONTRAST TECHNIQUE: Multidetector CT imaging of the lumbar spine was performed without intravenous contrast administration. Multiplanar CT image reconstructions were also generated. COMPARISON:  09/07/2016 MRI FINDINGS: Segmentation: 5 lumbar type vertebrae. Alignment: Grade 1 anterolisthesis at L4-5 and L5-S1, facet mediated. Vertebrae: Negative for acute fracture, endplate erosion, or focal bone lesion. L5 pars defects were questioned on previous lumbar spine MRI, not present based on this study. Paraspinal and other soft tissues: Atherosclerosis of the aorta iliacs. Focal intimal calcium displacement at the right common iliac artery, which measures up to 2 cm in diameter. Disc levels: T12- L1: Spondylosis.  No impingement L1-L2: Mild spondylosis and degenerative facet spurring. No evidence of impingement L2-L3: Spondylosis and mild degenerative facet spurring. No evidence of impingement L3-L4: Spondylosis and mild degenerative facet spurring. No evidence of impingement L4-L5: Severe facet arthropathy with sclerosis, joint distortion, and bulky spurring. Grade 1 anterolisthesis. Ligamentum flavum thickening. Spinal stenosis and bilateral subarticular recess impingement, better characterized on preceding MRI. L5-S1:Advanced facet arthropathy with anterolisthesis and bulky spurring with joint distortion. Grade 1 anterolisthesis. Vacuum phenomenon within disc fissures. Coronal reformats have been requested. In the meantime, coronal reformats were generated on terrarecon. IMPRESSION: 1. Advanced facet arthropathy at L4-5 and L5-S1 with grade 1  anterolisthesis. 2. L4-5 spinal stenosis and bilateral subarticular recess impingement. 3. Aortic Atherosclerosis (ICD10-I70.0). 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 Electronically Signed   By: Marnee Spring M.D.   On: 09/18/2016 14:02   Dg C-arm Gt 120 Min  Result Date: 10/06/2016 CLINICAL DATA:  L4-5 and L5-S1 PLIF EXAM: DG C-ARM GT 120 MIN; LUMBAR SPINE - 2-3 VIEW COMPARISON:  09/18/2016 FLUOROSCOPY TIME:  Radiation Exposure Index (as provided by the fluoroscopic device): Not available If the device does not provide the exposure index: Fluoroscopy Time:  1 minutes 5 seconds Number of Acquired Images:  2 FINDINGS: Pedicle screws are noted at L4-L5 and S1 with interbody fusion at both levels. Posterior fixation elements are noted. IMPRESSION: L4-5 and L5-S1 PLIF Electronically Signed   By: Alcide Clever M.D.   On: 10/06/2016 14:23    Microbiology: No results found for this or any previous visit (from the past 240 hour(s)).   Labs: Basic Metabolic Panel:  Recent Labs Lab 10/06/16 1215 10/06/16 1337 10/07/16 0449 10/11/16 1747 10/12/16 0235  NA 134* 134* 135 130* 129*  K 5.1 4.7 4.6 3.7 3.9  CL  --   --  100* 96* 97*  CO2  --   --  26 23 25   GLUCOSE 141* 153* 127* 53* 132*  BUN  --   --  13 13 14   CREATININE  --   --  0.81 0.74 0.72  CALCIUM  --   --  8.8* 9.2 8.5*   Liver Function Tests:  Recent Labs Lab 10/12/16 0235  AST 30  ALT 22  ALKPHOS 49  BILITOT 0.4  PROT 6.0*  ALBUMIN 3.0*   No results for input(s): LIPASE, AMYLASE in the last 168 hours. No results for input(s): AMMONIA in the last 168 hours. CBC:  Recent Labs Lab 10/06/16 1215 10/06/16 1337 10/07/16 0449 10/11/16 1747  WBC  --   --  11.6* 9.0  NEUTROABS  --   --   --  7.0  HGB 13.6 11.6* 10.6* 11.2*  HCT 40.0 34.0* 32.1* 32.6*  MCV  --   --  90.4 90.1  PLT  --   --  184 PLATELETS APPEAR ADEQUATE   Cardiac Enzymes: No results for input(s): CKTOTAL, CKMB, CKMBINDEX,  TROPONINI in the last 168 hours. BNP: BNP (last 3 results) No results for input(s): BNP in the last 8760 hours.  ProBNP (last 3 results) No results for input(s): PROBNP in the last 8760 hours.  CBG:  Recent Labs Lab 10/12/16 0112 10/12/16 0303 10/12/16 0458 10/12/16 0631 10/12/16 0756  GLUCAP 136* 135* 150* 154* 150*       Signed:  Rhetta MuraSAMTANI, JAI-GURMUKH MD   [facility] 10/12/2016, 8:18 AM

## 2016-10-17 NOTE — Op Note (Signed)
PREOP DIAGNOSIS:  1. Spondylolisthesis L4-5, L5-S1  POSTOP DIAGNOSIS: Same  PROCEDURE: 1. L4-5, L5-S1 laminectomy with facetectomy for decompression of exiting nerve roots, more than would be required for placement of interbody graft 2. Placement of anterior interbody device - Medtronic expandable cage 3. Posterior instrumentation using pedicle screws at L4-S1 Medtronic Solera 4. Interbody arthrodesis, L4-5, L5-S1 5. Posterolateral arthrodesis, L4-5, L5-S1 6. Use of locally harvested bone autograft 7. Use of non-structural bone allograft - BMP  SURGEON: Dr. Lisbeth RenshawNeelesh Yidel Teuscher, MD  ASSISTANT: Dr. Marikay Alaravid Jones, MD  ANESTHESIA: General Endotracheal  EBL: 1800cc  SPECIMENS: None  DRAINS: None  COMPLICATIONS: None immediate  CONDITION: Hemodynamically stable to PACU  HISTORY: Kenneth Thompson is a 66 y.o. male who has been followed in the outpatient clinic with back and leg pain related to spondylosis, stenosis, and spondylolisthesis at L4-5 and L5-S1. He attempted multiple conservative treatments and ultimately elected to proceed with surgical decompression and fusion. Risks and benefits were reviewed and consent was obtained.  PROCEDURE IN DETAIL: After informed consent was obtained and witnessed, the patient was brought to the operating room. After induction of general anesthesia, the patient was positioned on the operative table in the prone position. All pressure points were meticulously padded. Incision was then marked out and prepped and draped in the usual sterile fashion.  After timeout was conducted, skin was infiltrated with local anesthetic. Skin incision was then made sharply and Bovie electrocautery was used to dissect the subcutaneous tissue until the lumbodorsal fascia was identified and incised. The muscle was then elevated in the subperiosteal plane and the L5 and S1 lamina and L3-4, L4-5, and L5-S1 facet complexes were identified. Self-retaining retractors were then  placed.  Utilizing preoperative CT scan, screw trajectories were planned preoperatively. AP and lateral fluoro were used for registration. The Mazor robot was then used to drill and tap pedicle screws at L4, L5 and S1.  At this point attention was turned to decompression. Complete L4 and L5 laminectomy with facetectomy was completed using a combination of Kerrison rongeurs and a high-speed drill. Good decompression of the exiting and traversing roots was confirmed.  Disc spaces at L4-5 and L5-S1 were then identified, incised, and using a combination of shavers, curettes and rongeurs, complete discectomy was completed at both levels. Endplates were prepared, and Medtronic expandable cages were tapped into place bilaterally at L5-S1 and L4-5.  Bone harvested during decompression was morcellized and packed into the interspace as well. Good position was confirmed with fluoroscopy.  At this point, the L4, L5, and S1 pedicle screws were placed in the previously drilled and tapped pilot holes. Rod was then placed, set screws placed and final tightened. Final AP and lateral fluoroscopic images confirmed good position.  The lateral facet complexes were then exposed as was the transverse processes at L4, L5, and S1. These posterolateral surfaces were then decorticated with the high-speed drill in preparation for posterolateral arthrodesis. The remainder of the autologous bone graft was then placed in the lateral gutters with the BMP.  The wound was then irrigated with copious amounts of antibiotic saline, then closed in standard fashion using a combination of interrupted 0 and 3-0 Vicryl stitches in the muscular, fascial, and subcutaneous layers. Skin was then closed using standard Dermabond. Sterile dressing was then applied. The patient was then transferred to the stretcher, extubated, and taken to the postanesthesia care unit in stable hemodynamic condition.  At the end of the case all sponge, needle,  cottonoid, and instrument counts  were correct.

## 2016-10-23 ENCOUNTER — Encounter: Payer: Self-pay | Admitting: Neurology

## 2016-11-10 ENCOUNTER — Ambulatory Visit (INDEPENDENT_AMBULATORY_CARE_PROVIDER_SITE_OTHER): Payer: Commercial Managed Care - PPO | Admitting: Nurse Practitioner

## 2016-11-10 ENCOUNTER — Encounter: Payer: Self-pay | Admitting: Nurse Practitioner

## 2016-11-10 VITALS — BP 136/79 | HR 57 | Ht 76.0 in | Wt 259.6 lb

## 2016-11-10 DIAGNOSIS — G5790 Unspecified mononeuropathy of unspecified lower limb: Secondary | ICD-10-CM | POA: Insufficient documentation

## 2016-11-10 DIAGNOSIS — R269 Unspecified abnormalities of gait and mobility: Secondary | ICD-10-CM

## 2016-11-10 DIAGNOSIS — G5793 Unspecified mononeuropathy of bilateral lower limbs: Secondary | ICD-10-CM | POA: Diagnosis not present

## 2016-11-10 DIAGNOSIS — Z5181 Encounter for therapeutic drug level monitoring: Secondary | ICD-10-CM | POA: Diagnosis not present

## 2016-11-10 MED ORDER — L-METHYLFOLATE-B6-B12 3-35-2 MG PO TABS: 1.0000 | ORAL_TABLET | Freq: Every day | ORAL | 1 refills | Status: DC

## 2016-11-10 NOTE — Patient Instructions (Addendum)
Continue gabapentin 1200 mg  4 times daily Continue Oxtellar ER as directed Continue baclofen 10 mg 3 tablets  3 times daily Try Metanx for neuropathy pain Given information on diabetic neuropathy Follow-up in 3 months  Peripheral Neuropathy Peripheral neuropathy is a type of nerve damage. It affects nerves that carry signals between the spinal cord and other parts of the body. These are called peripheral nerves. With peripheral neuropathy, one nerve or a group of nerves may be damaged. What are the causes? Many things can damage peripheral nerves. For some people with peripheral neuropathy, the cause is unknown. Some causes include:  Diabetes. This is the most common cause of peripheral neuropathy.  Injury to a nerve.  Pressure or stress on a nerve that lasts a long time.  Too little vitamin B. Alcoholism can lead to this.  Infections.  Autoimmune diseases, such as multiple sclerosis and systemic lupus erythematosus.  Inherited nerve diseases.  Some medicines, such as cancer drugs.  Toxic substances, such as lead and mercury.  Too little blood flowing to the legs.  Kidney disease.  Thyroid disease.  What are the signs or symptoms? Different people have different symptoms. The symptoms you have will depend on which of your nerves is damaged. Common symptoms include:  Loss of feeling (numbness) in the feet and hands.  Tingling in the feet and hands.  Pain that burns.  Very sensitive skin.  Weakness.  Not being able to move a part of the body (paralysis).  Muscle twitching.  Clumsiness or poor coordination.  Loss of balance.  Not being able to control your bladder.  Feeling dizzy.  Sexual problems.  How is this diagnosed? Peripheral neuropathy is a symptom, not a disease. Finding the cause of peripheral neuropathy can be hard. To figure that out, your health care provider will take a medical history and do a physical exam. A neurological exam will also be  done. This involves checking things affected by your brain, spinal cord, and nerves (nervous system). For example, your health care provider will check your reflexes, how you move, and what you can feel. Other types of tests may also be ordered, such as:  Blood tests.  A test of the fluid in your spinal cord.  Imaging tests, such as CT scans or an MRI.  Electromyography (EMG). This test checks the nerves that control muscles.  Nerve conduction velocity tests. These tests check how fast messages pass through your nerves.  Nerve biopsy. A small piece of nerve is removed. It is then checked under a microscope.  How is this treated?  Medicine is often used to treat peripheral neuropathy. Medicines may include: ? Pain-relieving medicines. Prescription or over-the-counter medicine may be suggested. ? Antiseizure medicine. This may be used for pain. ? Antidepressants. These also may help ease pain from neuropathy. ? Lidocaine. This is a numbing medicine. You might wear a patch or be given a shot. ? Mexiletine. This medicine is typically used to help control irregular heart rhythms.  Surgery. Surgery may be needed to relieve pressure on a nerve or to destroy a nerve that is causing pain.  Physical therapy to help movement.  Assistive devices to help movement. Follow these instructions at home:  Only take over-the-counter or prescription medicines as directed by your health care provider. Follow the instructions carefully for any given medicines. Do not take any other medicines without first getting approval from your health care provider.  If you have diabetes, work closely with your health care  provider to keep your blood sugar under control.  If you have numbness in your feet: ? Check every day for signs of injury or infection. Watch for redness, warmth, and swelling. ? Wear padded socks and comfortable shoes. These help protect your feet.  Do not do things that put pressure on your  damaged nerve.  Do not smoke. Smoking keeps blood from getting to damaged nerves.  Avoid or limit alcohol. Too much alcohol can cause a lack of B vitamins. These vitamins are needed for healthy nerves.  Develop a good support system. Coping with peripheral neuropathy can be stressful. Talk to a mental health specialist or join a support group if you are struggling.  Follow up with your health care provider as directed. Contact a health care provider if:  You have new signs or symptoms of peripheral neuropathy.  You are struggling emotionally from dealing with peripheral neuropathy.  You have a fever. Get help right away if:  You have an injury or infection that is not healing.  You feel very dizzy or begin vomiting.  You have chest pain.  You have trouble breathing. This information is not intended to replace advice given to you by your health care provider. Make sure you discuss any questions you have with your health care provider. Document Released: 04/28/2002 Document Revised: 10/14/2015 Document Reviewed: 01/13/2013 Elsevier Interactive Patient Education  2017 [employer].

## 2016-11-10 NOTE — Progress Notes (Addendum)
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  MRI thoracic spine showed T5-T11, there is intramedullary spinal cord edema, with enhancing intradural dilated venous plexus posteriorly. 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  He had angiogram by Dr. Launa Flight, angiographically no evidence of early arteriovenous shunting in the spinous axis from the craniovertebral junction to the lumbosacral region noted. No abnormal early prominent venous channels are seen in the midline or in the paramidline region of the cranial spinous axis.   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 Arlyss Queen, postsurgically, he can ambulate better, but he gradually developed bilateral lower extremity burning achy pain, bandlike sensation around his lower abdomen, urinary urgency   He also has history of hypertension, diabetes, obesity,  He is currently taking Neurontin 600 mg 2 tablets 3 times a day, baclofen 10 mg 3 times a day, he complains of gradual worsening eye lateral lower extremity achy pain, like he is walking on gravels, gait difficulty, urinary urgency, bowel urgency, occasionally incontinence. He has tried nortriptyline, up to 20 mg daily, not sure about the benefit   He has been self dosing him with titrating dose of ibuprofen, up to 4800 mg daily, tends to spend a lot of time raising his leg up, or sleeping in bed,  We have reviewed MRI thoracic in April 2015, Posterior  surgical decompression changes at T5 level. Small, serpiginous flow void signals noted in the posterior intra-dural extramedullary CSF space, from T3 to T8, consistent with spinal AVM. No intrinsic, compressive or abnormal enhancing spinal cord lesions. Compared to MRI from 08/10/10, spinal cord edema has resolved, fewer dilated blood vessels from AVM are seen, and post-surgical changes are a new finding.  MRI of lumbar spine showed multilevel degenerative disc disease, no significant foraminal, or canal stenosis.  UPDATE January 07 2015:[name] complains of constant bilateral lower extremity achy pain, he has stopped daily ibuprofen use, Celebrex 100 mg twice a day does not help, He is also taking gabapentin 1200 mg 3 times a day, Trileptal 150 mg twice a day has helpful, he is also taking baclofen 10 mg 2 tablets 3 times a day, wife reported he tends to drink alcohol before he goes to bed,   He uses CPAP machine at night, which has him sleep better.  Update May 18 2015:YY His balance is gradaully getting worse, he also complains of pain at the bottom of his feet, like walking on pebbles, he woke up frequently at night time, occasionally nocturnal urinary incontinence, daytime urinary frequency, hesitation,  He is now taking gabaentin up to 3600 mg daily, Oxtellar  iii bid and baclofen  tid.   UPDATE June 27th 2017:[name] has a lot of right hip pain, low back pain, bone pain, hurt after he walks for a while, back pain when he bending over, he can only walk 15-20 minutes, he is not active, he has not swim for 2 years,  Bilateral feet feel like in fire, burning and cold sometimes.  He is now taking gabaentin up to 3600 mg daily, Oxtellar 150mg  iii bid and baclofen 30mg  tid.   UPDATE Jan 4th 2018:[name] was seen by orthopedics recently for right hip pain, I personally reviewed x-ray on May 11 2016, there was no significant abnormality, he still has neuropathic pain at bilateral  feet, he is taking gabapentin 1800mg  bid, oxtellar 150mg  iii bid, baclofen 30mg  tid,  He sleeps a lot during the day, he has irregular night time sleep schedule.  He reported low sodium recent laboratory evaluations. UPDATE 06/22/2018CM [name], 66 year old male returns for follow-up for his neuropathic pain bilateral feet. He is currently taking baclofen 30 mg 3 times a day, patellar 450 by mouth twice a day, and gabapentin 1200mg  4 times daily he is continuing to have difficulty with burning feet bilateral pain and lower extremity weakness. He has hip pain and back pain ambulates in the home walker, stays in the chair a lot due to his pain . He reports that his diabetes is in good control recently. He did have an ER admission for low blood sugar. He returns for reevaluation    REVIEW OF SYSTEMS: Full 14 system review of systems performed and notable only for those listed, all others are neg:  Constitutional: neg  Cardiovascular: Leg swelling Ear/Nose/Throat: neg  Skin: neg Eyes: neg Respiratory: neg Gastroitestinal: Urinary urgency Hematology/Lymphatic: neg  Endocrine: neg Musculoskeletal: Joint pain walking difficulty Allergy/Immunology: neg Neurological: Numbness weakness , burning in the feet Psychiatric: Depression and anxiety Sleep : neg   ALLERGIES: No Known Allergies  HOME MEDICATIONS: Outpatient Medications Prior to Visit  Medication Sig Dispense Refill  . aspirin 81 MG tablet Take 81 mg by mouth daily.    Marland Kitchen atenolol (TENORMIN) 50 MG tablet Take 50 mg by mouth daily.  11  . baclofen (LIORESAL) 10 MG tablet Take 3 tablets (30 mg total) by mouth 3 (three) times daily. 270 tablet 11  . gabapentin (NEURONTIN) 600 MG tablet Take 2 tablets (1,200 mg total) by mouth 4 (four) times daily. 240 tablet 11  . gemfibrozil (LOPID) 600 MG tablet Take 600 mg by mouth 2 (two) times daily.     Marland Kitchen glucose blood (ONE TOUCH ULTRA TEST) test strip Check cbgs 4 times a day 100 each 12  .  losartan (COZAAR) 100 MG tablet Take 100 mg by mouth daily.     . metFORMIN (GLUCOPHAGE) 500 MG tablet Take 1,000 mg by mouth 2 (two) times daily.     . OXcarbazepine ER (OXTELLAR XR) 150 MG TB24 Take 450 mg by mouth 2 (two) times daily. 540 tablet 4  . oxyCODONE-acetaminophen (PERCOCET/ROXICET) 5-325 MG tablet Take 0.5 tablets by mouth 2 (two) times daily as needed for severe pain.     No facility-administered medications prior to visit.     PAST MEDICAL HISTORY: Past Medical History:  Diagnosis Date  . Anxiety   . Arthritis   . Diabetes (HCC)   . High cholesterol   . Neuropathic pain of lower extremity   . Sleep apnea    CPAP     over 5 years ago   does not know where done    PAST SURGICAL HISTORY: Past Surgical History:  Procedure Laterality Date  . APPLICATION OF ROBOTIC ASSISTANCE FOR SPINAL PROCEDURE N/A 10/06/2016   Procedure: APPLICATION OF ROBOTIC ASSISTANCE FOR SPINAL PROCEDURE;  Surgeon: Lisbeth Renshaw, MD;  Location: [facility] OR;  Service: Neurosurgery;  Laterality: N/A;  . Back Surgeries  2012   Total  3 back sirgeries  . BACK SURGERY     Spinal AV fistula  . EYE SURGERY Bilateral 2017   cataracts removed  . Right foot/ ankle reconstruction      FAMILY HISTORY: Family History  Problem Relation Age of Onset  . Cancer Mother   . Cancer Father     SOCIAL HISTORY: Social History   Social History  . Marital status: Married    Spouse name: Lupita Leash  . Number of children: 2  . Years of education: 12   Occupational History  .  [employer]   Social History Main Topics  . Smoking status: Former Smoker    Packs/day: 0.25    Years: 35.00    Quit date: 10/06/2016  . Smokeless tobacco: Never Used  . Alcohol use No     Comment: quit 4-5 months ago  . Drug use: No  . Sexual activity: Not on file   Other Topics Concern  . Not on file   Social History Narrative   Patient is married and lives at home with wife.    Patient has a Dietitian.   Patient drinks 6-8 cups of caffeine daily.    Right-handed.           PHYSICAL EXAM  Vitals:   11/10/16 0848  BP: 136/79  Pulse: (!) 57  Weight: 259 lb 9.6 oz (117.8 kg)  Height: 6\' 4"  (1.93 m)   Body mass index is 31.6 kg/m.  Generalized: Well developed,Obese male in no acute distress  Head: normocephalic and atraumatic,. Oropharynx benign  Neck: Supple,   Musculoskeletal: No deformity   Neurological examination   Mentation: Alert oriented to time, place, history taking. Attention span and concentration appropriate. Recent and remote memory intact.  Follows all commands speech and language fluent.   Cranial nerve II-XII: Pupils were equal round reactive to light extraocular movements were full, visual field were full on confrontational test. Facial sensation and strength were normal. hearing was intact to finger rubbing bilaterally. Uvula tongue midline. head turning and shoulder shrug were normal and symmetric.Tongue protrusion into cheek strength was normal. Motor:There is no pronator drift of out-stretched arms. Muscle bulk and tone are normal. Muscle strength is normal at bilateral upper extremities  Mild to moderate spasticity of bilateral lower extremity, no significant weakness Sensory: Length dependent decreased Light touch, pinprick to distal legs decreased vibration sense  at toes  Coordination: Rapid alternating movements and fine finger movements are intact. limited at heel-to-shin due to spasticity  Reflexes: Brachioradialis 2/2, biceps 2/2, triceps 2/2, patellar 3/3, Achilles 3/3, plantar responses were flexor bilaterally. Gait and Station: He needs to push up to get up from seated position, wide-based, cautious, stiff gait with walker.   DIAGNOSTIC DATA (LABS, IMAGING, TESTING) - I reviewed patient records, labs, notes, testing and imaging myself where available.  Lab Results  Component Value Date   WBC 9.0 10/11/2016   HGB 11.2 (L) 10/11/2016     HCT 32.6 (L) 10/11/2016   MCV 90.1 10/11/2016   PLT PLATELETS APPEAR ADEQUATE 10/11/2016      Component Value Date/Time   NA 129 (L) 10/12/2016 0235   K 3.9 10/12/2016 0235   CL 97 (L) 10/12/2016 0235   CO2 25 10/12/2016 0235   GLUCOSE 132 (H) 10/12/2016 0235   BUN 14 10/12/2016 0235   CREATININE 0.72 10/12/2016 0235   CALCIUM 8.5 (L) 10/12/2016 0235   PROT 6.0 (L) 10/12/2016 0235   ALBUMIN 3.0 (  L) 10/12/2016 0235   AST 30 10/12/2016 0235   ALT 22 10/12/2016 0235   ALKPHOS 49 10/12/2016 0235   BILITOT 0.4 10/12/2016 0235   GFRNONAA >60 10/12/2016 0235   GFRAA >60 10/12/2016 0235    Lab Results  Component Value Date   HGBA1C 5.6 09/28/2016    ASSESSMENT AND PLAN  66 y.o. year old male  has a past medical history of Anxiety; Arthritis; Diabetes (HCC); High cholesterol; Neuropathic pain of lower extremity; and Sleep apnea.History ofT5-T11 AVM status post resection 2012. Overall spastic paraplegia, gait difficulty here to follow-up . Right hip pain, low back pain, most likely musculoskeletal etiology Is followed up by orthopedic surgeon. The patient is a current patient of Dr. Terrace ArabiaYan  who is out of the office today . This note is sent to the work in doctor.       PLAN: Continue gabapentin 1200 mg  4 times daily Continue Oxtellar ER as directed will check BMP today to monitor adverse effects and check sodium level Continue baclofen 10 mg 3 tablets  3 times daily Try Metanx for neuropathy pain Given information on diabetic neuropathy Follow-up in 3 months I spent 25 minutes in total face to face time with the patient more than 50% of which was spent counseling and coordination of care, reviewing test results reviewing medications and discussing and reviewing the diagnosis of neuropathy and further treatment options. We also reviewed a written handout Nilda RiggsNancy Carolyn Martin, [facility] HospitalGNP, [facility] CenterBC, APRN  [facility] [address], [address] [place], [place] [address] [phone]  [phone]  I reviewed the above note and documentation by the Nurse Practitioner and agree with the history, physical exam, assessment and plan as outlined above. I am concerned about the amount of neurontin and baclofen patient is on, though. Please double check on the dosing.  I was immediately available for face-to-face consultation. Huston FoleySaima Athar, MD, PhD [facility])

## 2016-11-11 LAB — BASIC METABOLIC PANEL
BUN / CREAT RATIO: 16 (ref 10–24)
BUN: 13 mg/dL (ref 8–27)
CHLORIDE: 90 mmol/L — AB (ref 96–106)
CO2: 19 mmol/L — ABNORMAL LOW (ref 20–29)
Calcium: 9.5 mg/dL (ref 8.6–10.2)
Creatinine, Ser: 0.8 mg/dL (ref 0.76–1.27)
GFR calc non Af Amer: 94 mL/min/{1.73_m2} (ref >59)
GFR, EST AFRICAN AMERICAN: 108 mL/min/{1.73_m2} (ref >59)
GLUCOSE: 105 mg/dL — AB (ref 65–99)
POTASSIUM: 5.2 mmol/L (ref 3.5–5.2)
SODIUM: 126 mmol/L — AB (ref 134–144)

## 2016-11-13 ENCOUNTER — Telehealth: Payer: Self-pay | Admitting: Nurse Practitioner

## 2016-11-13 NOTE — Telephone Encounter (Signed)
LMVM for pt to return call for lab results.  

## 2016-11-13 NOTE — Telephone Encounter (Signed)
Sodium levels down to 126. Discussed with Dr. Roda ShuttersXu in Dr. Zannie CoveYan's absence. Decrease Oxtellar to 300mg  twice daily (pt has 150mg  tabs) and after being on that dose for 2 weeks have repeat sodium level (BMP) please call the patient

## 2016-11-14 NOTE — Telephone Encounter (Signed)
LMVM for pt and wife to return call for lab results.

## 2016-11-15 ENCOUNTER — Ambulatory Visit: Payer: BLUE CROSS/BLUE SHIELD | Admitting: Neurology

## 2016-11-15 NOTE — Telephone Encounter (Signed)
[facility] [place] Dr. Valentina LucksGriffin labs and ofv message re"labs. [phone].

## 2016-11-15 NOTE — Telephone Encounter (Signed)
Spoke to pt and relayed the lab results.  He had been to his pcp, Dr. Valentina LucksGriffin yesterday and had labs done as well.  Low sodium 126 as well.  She said to add salt to diet.  Has repeat labs on 11-23-16 with Dr. Valentina LucksGriffin.  I will fax to Dr. Valentina LucksGriffin note, just for her information.  Still decrease oxtellar? I will need to call pt back. [phone].

## 2016-11-15 NOTE — Telephone Encounter (Signed)
I had already spoken to Dr. Roda ShuttersXu regarding this. Oxtellar  Causes decreased  Sodium. He needs to decrease the dose

## 2016-11-15 NOTE — Telephone Encounter (Signed)
Spoke to pt and relayed that oxtellar does cause low sodium per CM/NP and he needs to decrease his dose of oxtellar to 300mg  po bid. And get repeat lab in 2 wks.  His pcp, [facility]. Valent[facility]Griffin [facility] will be repeating 11-23-16 and he will have them fax to us.  I said that would be fine.

## 2017-01-11 ENCOUNTER — Telehealth: Payer: Self-pay | Admitting: Neurology

## 2017-01-11 MED ORDER — LIDOCAINE 4 % EX CREA: 1.0000 "application " | TOPICAL_CREAM | Freq: Three times a day (TID) | CUTANEOUS | 0 refills | Status: DC | PRN

## 2017-01-11 NOTE — Telephone Encounter (Signed)
Ok, per vo by Dr. Terrace Arabia, to renew the prescription she has provided him in the past.  Rx sent to pharmacy and patient notified.  He is aware this may be a medication that will not be covered by his insurance plan.

## 2017-01-11 NOTE — Addendum Note (Signed)
Addendum  created 01/11/17 1442 by Keeshia Sanderlin, MD   Sign clinical note    

## 2017-01-11 NOTE — Telephone Encounter (Signed)
Patient called office in reference to having trouble with his hip post back surgery and doing PT.  He would like to know if Dr. Terrace Arabia can prescribe Lidocaine cream again said she prescribed it a long time ago not sure when.  Pharmacy- [facility] on [address]  Please call with any questions.

## 2017-01-11 NOTE — Addendum Note (Signed)
Addended by: Lindell Spar C on: 01/11/2017 03:16 PM   Modules accepted: Orders

## 2017-01-29 ENCOUNTER — Ambulatory Visit (INDEPENDENT_AMBULATORY_CARE_PROVIDER_SITE_OTHER): Payer: Commercial Managed Care - PPO | Admitting: Neurology

## 2017-01-29 ENCOUNTER — Encounter: Payer: Self-pay | Admitting: Neurology

## 2017-01-29 VITALS — BP 117/69 | HR 57 | Ht 76.0 in | Wt 249.5 lb

## 2017-01-29 DIAGNOSIS — G5793 Unspecified mononeuropathy of bilateral lower limbs: Secondary | ICD-10-CM | POA: Diagnosis not present

## 2017-01-29 DIAGNOSIS — R269 Unspecified abnormalities of gait and mobility: Secondary | ICD-10-CM | POA: Diagnosis not present

## 2017-01-29 DIAGNOSIS — M5416 Radiculopathy, lumbar region: Secondary | ICD-10-CM | POA: Diagnosis not present

## 2017-01-29 MED ORDER — BACLOFEN 10 MG PO TABS: 40.0000 mg | ORAL_TABLET | Freq: Three times a day (TID) | ORAL | 11 refills | Status: DC

## 2017-01-29 MED ORDER — OXCARBAZEPINE 150 MG PO TABS: 300.0000 mg | ORAL_TABLET | Freq: Two times a day (BID) | ORAL | 11 refills | Status: DC

## 2017-01-29 MED ORDER — GABAPENTIN 600 MG PO TABS: 1200.0000 mg | ORAL_TABLET | Freq: Four times a day (QID) | ORAL | 11 refills | Status: DC

## 2017-01-29 NOTE — Progress Notes (Signed)
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  MRI thoracic spine showed T5-T11, there is intramedullary spinal cord edema, with enhancing intradural dilated venous plexus posteriorly. 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  He had angiogram by Dr. Launa Flight, angiographically no evidence of early arteriovenous shunting in the spinous axis from the craniovertebral junction to the lumbosacral region noted. No abnormal early prominent venous channels are seen in the midline or in the paramidline region of the cranial spinous axis.   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 Arlyss Queen, postsurgically, he can ambulate better, but he gradually developed bilateral lower extremity burning achy pain, bandlike sensation around his lower abdomen, urinary urgency   He also has history of hypertension, diabetes, obesity,  He is currently taking Neurontin 600 mg 2 tablets 3 times a day, baclofen 10 mg 3 times a day, he complains of gradual worsening eye lateral lower extremity achy pain, like he is walking on gravels, gait difficulty, urinary urgency, bowel urgency, occasionally incontinence. He has tried nortriptyline, up to 20 mg daily, not sure about the benefit   He has been self dosing him with titrating dose of ibuprofen, up to 4800 mg daily, tends to spend a lot of time raising his leg up, or sleeping in bed,  We have reviewed MRI thoracic in April 2015, Posterior  surgical decompression changes at T5 level. Small, serpiginous flow void signals noted in the posterior intra-dural extramedullary CSF space, from T3 to T8, consistent with spinal AVM. No intrinsic, compressive or abnormal enhancing spinal cord lesions. Compared to MRI from 08/10/10, spinal cord edema has resolved, fewer dilated blood vessels from AVM are seen, and post-surgical changes are a new finding.  MRI of lumbar spine showed multilevel degenerative disc disease, no significant foraminal, or canal stenosis.  UPDATE January 07 2015:[name] complains of constant bilateral lower extremity achy pain, he has stopped daily ibuprofen use, Celebrex 100 mg twice a day does not help, He is also taking gabapentin 1200 mg 3 times a day, Trileptal 150 mg twice a day has helpful, he is also taking baclofen 10 mg 2 tablets 3 times a day, wife reported he tends to drink alcohol before he goes to bed,   He uses CPAP machine at night, which has him sleep better.  Update May 18 2015:YY His balance is gradaully getting worse, he also complains of pain at the bottom of his feet, like walking on pebbles, he woke up frequently at night time, occasionally nocturnal urinary incontinence, daytime urinary frequency, hesitation,  He is now taking gabaentin up to 3600 mg daily, Oxtellar  iii bid and baclofen  tid.   UPDATE June 27th 2017:[name] has a lot of right hip pain, low back pain, bone pain, hurt after he walks for a while, back pain when he bending over, he can only walk 15-20 minutes, he is not active, he has not swim for 2 years,  Bilateral feet feel like in fire, burning and cold sometimes.  He is now taking gabaentin up to 3600 mg daily, Oxtellar  iii bid and baclofen  tid.   UPDATE Jan 4th 2018:[name] was seen by orthopedics recently for right hip pain, I personally reviewed x-ray on May 11 2016, there was no significant abnormality, he still has neuropathic pain at bilateral  feet, he is taking gabapentin  bid, oxtellar  iii bid, baclofen  tid,  He sleeps a lot during the day, he has irregular night time sleep schedule.  He reported low sodium recent laboratory evaluations.  UPDATE Sept 10 2018: He had L4-5, L5-S1 laminectomy with facetectomy for decompression of nerve roots, placement of anterior interbody device on Oct 06 2016,  which has helped his low back pain,   Now he has toes curling up, feet and leg numbness, more on left side, use walkers now, urinary urgency, more gait abnormality.  He also suffered hypoglycemia episode,   REVIEW OF SYSTEMS: Full 14 system review of systems performed and notable only for those listed, all others are neg:   Codeine intolerance, insomnia, apnea, difficulty awakening, daytime sleepiness, snoring, difficulty urinate, incontinence of bladder, urgency, joint pain, swelling achy muscles, walking difficulty, memory loss, dizziness, numbness, weakness, tremor, agitation, confusion decreased concentration depression anxiety    ALLERGIES: No Known Allergies  HOME MEDICATIONS: Outpatient Medications Prior to Visit  Medication Sig Dispense Refill  . aspirin 81 MG tablet Take 81 mg by mouth daily.    Marland Kitchen atenolol (TENORMIN) 50 MG tablet Take 50 mg by mouth daily.  11  . baclofen (LIORESAL) 10 MG tablet Take 3 tablets (30 mg total) by mouth 3 (three) times daily. 270 tablet 11  . gabapentin (NEURONTIN) 600 MG tablet Take 2 tablets (1,200 mg total) by mouth 4 (four) times daily. 240 tablet 11  . gemfibrozil (LOPID) 600 MG tablet Take 600 mg by mouth 2 (two) times daily.     Marland Kitchen glucose blood (ONE TOUCH ULTRA TEST) test strip Check cbgs 4 times a day 100 each 12  . l-methylfolate-B6-B12 (METANX) 3-35-2 MG TABS tablet Take 1 tablet by mouth daily. 90 tablet 1  . losartan (COZAAR) 100 MG tablet Take 100 mg by mouth daily.     . metFORMIN (GLUCOPHAGE) 500 MG tablet Take 1,000 mg by mouth 2 (two) times daily.     .  OXcarbazepine ER 150 MG TB24 Take 300 mg by mouth 2 (two) times daily.    Marland Kitchen lidocaine (LMX) 4 % cream Apply 1 application topically 3 (three) times daily as needed. 30 g 0  . oxyCODONE-acetaminophen (PERCOCET/ROXICET) 5-325 MG tablet Take 0.5 tablets by mouth 2 (two) times daily as needed for severe pain.     No facility-administered medications prior to visit.     PAST MEDICAL HISTORY: Past Medical History:  Diagnosis Date  . Anxiety   . Arthritis   . Diabetes (HCC)   . High cholesterol   . Neuropathic pain of lower extremity   . Sleep apnea    CPAP     over 5 years ago   does not know where done    PAST SURGICAL HISTORY: Past Surgical History:  Procedure Laterality Date  . APPLICATION OF ROBOTIC ASSISTANCE FOR SPINAL PROCEDURE N/A 10/06/2016   Procedure: APPLICATION OF ROBOTIC ASSISTANCE FOR SPINAL PROCEDURE;  Surgeon: Lisbeth Renshaw, MD;  Location: [facility] OR;  Service: Neurosurgery;  Laterality: N/A;  . Back Surgeries  2012   Total 3 back sirgeries  . BACK SURGERY     Spinal AV  fistula  . EYE SURGERY Bilateral 2017   cataracts removed  . Right foot/ ankle reconstruction      FAMILY HISTORY: Family History  Problem Relation Age of Onset  . Cancer Mother   . Cancer Father     SOCIAL HISTORY: Social History   Social History  . Marital status: Married    Spouse name: Lupita Leash  . Number of children: 2  . Years of education: 12   Occupational History  .  [employer]   Social History Main Topics  . Smoking status: Former Smoker    Packs/day: 0.25    Years: 35.00    Quit date: 10/06/2016  . Smokeless tobacco: Never Used  . Alcohol use No     Comment: quit 4-5 months ago  . Drug use: No  . Sexual activity: Not on file   Other Topics Concern  . Not on file   Social History Narrative   Patient is married and lives at home with wife.    Patient has a Barista.   Patient drinks 6-8 cups of caffeine daily.    Right-handed.            PHYSICAL EXAM  Vitals:   01/29/17 1448  BP: 117/69  Pulse: (!) 57  Weight: 249 lb 8 oz (113.2 kg)  Height:  (1.93 m)   Body mass index is 30.37 kg/m.  Generalized: Well developed,Obese male in no acute distress  Head: normocephalic and atraumatic,. Oropharynx benign  Neck: Supple,   Musculoskeletal: No deformity   Neurological examination   Mentation: Alert oriented to time, place, history taking. Attention span and concentration appropriate. Recent and remote memory intact.  Follows all commands speech and language fluent.   Cranial nerve II-XII: Pupils were equal round reactive to light extraocular movements were full, visual field were full on confrontational test. Facial sensation and strength were normal. hearing was intact to finger rubbing bilaterally. Uvula tongue midline. head turning and shoulder shrug were normal and symmetric.Tongue protrusion into cheek strength was normal. Motor:There is no pronator drift of out-stretched arms. He has significant left ankle drop, mild bilateral ankle plantar flexion weakness,  Sensory: Length dependent decreased Light touch, pinprick to distal legs decreased vibration sense to knee level  Coordination: Rapid alternating movements and fine finger movements are intact. limited at heel-to-shin due to spasticity  Reflexes: Brachioradialis 2/2, biceps 2/2, triceps 2/2, patellar 3/3, Achilles absent, plantar responses were flexor bilaterally Gait and Station: He needs to push up to get up from seated position, dragging left leg  DIAGNOSTIC DATA (LABS, IMAGING, TESTING) - I reviewed patient records, labs, notes, testing and imaging myself where available.  Lab Results  Component Value Date   WBC 9.0 10/11/2016   HGB 11.2 (L) 10/11/2016   HCT 32.6 (L) 10/11/2016   MCV 90.1 10/11/2016   PLT PLATELETS APPEAR ADEQUATE 10/11/2016      Component Value Date/Time   NA 126 (L) 11/10/2016 0952   K 5.2 11/10/2016 0952   CL 90  (L) 11/10/2016 0952   CO2 19 (L) 11/10/2016 0952   GLUCOSE 105 (H) 11/10/2016 0952   GLUCOSE 132 (H) 10/12/2016 0235   BUN 13 11/10/2016 0952   CREATININE 0.80 11/10/2016 0952   CALCIUM 9.5 11/10/2016 0952   PROT 6.0 (L) 10/12/2016 0235   ALBUMIN 3.0 (L) 10/12/2016 0235   AST 30 10/12/2016 0235   ALT 22 10/12/2016 0235   ALKPHOS 49 10/12/2016 0235   BILITOT  0.4 10/12/2016 0235   GFRNONAA 94 11/10/2016 0952   GFRAA 108 11/10/2016 0952    Lab Results  Component Value Date   HGBA1C 5.6 09/28/2016    ASSESSMENT AND PLAN  66 y.o. year old male   History of T5-T11 AVM status post resection 2012.   Overall spastic paraplegia, gait difficulty here to follow-up . Bilateral lower extremity neuropathic pain,   Continue gabapentin 600 mg 2 tablets 4 times a day   Trileptal 150 mg 2 tablets twice a day   Baclofen increased to 10 mg 4 tablets 3 times a day  Low back pain  S/p decompression, worsening left leg weakness, gait abnormality.  EMG/NCS.  Levert FeinsteinYijun Osten Janek, M.D. Ph.D.  [facility] [address] [place], [place] [address] Phone: [phone] Fax:      [phone]

## 2017-03-02 ENCOUNTER — Encounter: Payer: Commercial Managed Care - PPO | Admitting: Neurology

## 2017-03-23 ENCOUNTER — Other Ambulatory Visit: Payer: Self-pay | Admitting: *Deleted

## 2017-03-23 ENCOUNTER — Ambulatory Visit (INDEPENDENT_AMBULATORY_CARE_PROVIDER_SITE_OTHER): Payer: Self-pay | Admitting: Neurology

## 2017-03-23 ENCOUNTER — Ambulatory Visit (INDEPENDENT_AMBULATORY_CARE_PROVIDER_SITE_OTHER): Payer: Commercial Managed Care - PPO | Admitting: Neurology

## 2017-03-23 DIAGNOSIS — R269 Unspecified abnormalities of gait and mobility: Secondary | ICD-10-CM

## 2017-03-23 DIAGNOSIS — M5416 Radiculopathy, lumbar region: Secondary | ICD-10-CM | POA: Diagnosis not present

## 2017-03-23 DIAGNOSIS — G5793 Unspecified mononeuropathy of bilateral lower limbs: Secondary | ICD-10-CM

## 2017-03-23 DIAGNOSIS — Z0289 Encounter for other administrative examinations: Secondary | ICD-10-CM

## 2017-03-23 NOTE — Patient Instructions (Addendum)
[facility]  [address] [place], [place] [address] Directions  [phone] 

## 2017-03-23 NOTE — Procedures (Signed)
Full Name: Kenneth Thompson Gender: Male MRN #: [id number] Date of Birth: 12/29/1950    Visit Date: 03/23/2017 08:14 Age: [age] Years 5511 Months Old Examining Physician: Kenneth Thompson Evon Dejarnett, MD  Referring Physician: Terrace ArabiaYan, MD  History: 66 year old male, with history of thoracic AVM, status post ablation, complains of persistent bilateral lower extremity neuropathic pain, worsening gait abnormality, also have a history of lumbar radiculopathy, status post decompression, recent onset of bilateral hands paresthesia.  Summary of the test:  Nerve conduction study:  Bilateral sural, superficial peroneal sensory responses were absent. Bilateral peroneal to EDB motor responses and the left tibial motor responses were absent. Right tibial motor responses showed severely decreased C map amplitude.  Right ulnar sensory responses showed moderately prolonged peak latency, with moderately decreased snap amplitude. Right ulnar motor responses showed moderately prolonged distal latency, with mildly decreased C map amplitude, and the mild to moderate deep decreased conduction velocity, moderately prolonged F-wave latency.  Right median sensory response was absent. Right radial sensory response showed mildly decreased snap amplitude. Right motor responses showed significantly prolonged distal latency, with significantly decreased C map amplitude, mild slow conduction velocity.  Electromyography: Selective needle examinations were performed at bilateral lower extremity muscles, bilateral lumbosacral paraspinals, right upper extremity, right cervical paraspinal muscles.  There was evidence of profound active denervation at the left tibial anterior, with complex enlarged motor unit potential, decreased recruitment, suggestive of active neuropathic process. There is also evidence of moderate active neuropathic process involving left peroneal longus, mild degree at left tibialis posterior, vastus lateralis.  There is  chronic neuropathic changes involving right tibialis anterior, tibialis posterior, peroneal longus, vastus lateralis.  Active denervation at left more than right lumbar paraspinals, this could be related to his recent lumbar decompression surgery in May 2018.  There is also evidence of mild chronic neuropathic changes involving right first dorsal interossei, abductor pollicis brevis. There is no evidence of active did relation at right cervical paraspinal muscles.    Conclusion: This is an abnormal study. 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 There is also superimposed bilateral lumbar radiculopathy, left worse than right, mainly involving bilateral L4, L5, S1 myotomes.  In addition, there is evidence of severe right carpal tunnel syndromes, demyelinating in nature. There is no evidence of right cervical radiculopathy.     ------------------------------- Kenneth Thompson Caryn Thompson, M.D.  [facility] [address] [place], [place] [address] Tel: [phone] Fax: [phone]        [facility]    Nerve / Sites Muscle Latency Ref. Amplitude Ref. Rel Amp Segments Distance Velocity Ref. Area    ms ms mV mV %  cm m/s m/s mVms  R Median - APB     Wrist APB 7.7 ?4.4 1.2 ?4.0 100 Wrist - APB 7   6.1     Upper arm APB 13.5  1.2  97.4 Upper arm - Wrist 25 42 ?49 4.9  R Ulnar - ADM     Wrist ADM 4.5 ?3.3 5.4 ?6.0 100 Wrist - ADM 7   15.4     B.Elbow ADM 10.5  4.7  87.1 B.Elbow - Wrist 24 40 ?49 15.2     A.Elbow ADM 13.5  4.3  91.4 A.Elbow - B.Elbow 12 39 ?49 15.2         A.Elbow - Wrist      R Peroneal - EDB     Ankle EDB NR ?6.5 NR ?2.0 NR Ankle - EDB 9   NR  Pop fossa - Ankle      L Peroneal - EDB     Ankle EDB NR ?6.5 NR ?2.0 NR Ankle - EDB 9   NR         Pop fossa - Ankle      R Tibial - AH     Ankle AH 5.7 ?5.8 0.7 ?4.0 100 Ankle - AH 9   2.6     Pop fossa AH 19.5  0.5  68.4 Pop fossa - Ankle 45 32 ?41 1.5  L Tibial - AH     Ankle AH NR ?5.8 NR ?4.0 NR  Ankle - AH 9   NR     Pop fossa AH      Pop fossa - Ankle   ?41                   SNC    Nerve / Sites Rec. Site Peak Lat Ref.  Amp Ref. Segments Distance    ms ms V V  cm  R Radial - Anatomical snuff box (Forearm)     Forearm Wrist 3.2 ?2.9 7 ?15 Forearm - Wrist 10  R Sural - Ankle (Calf)     Calf Ankle NR ?4.4 NR ?6 Calf - Ankle 14  L Sural - Ankle (Calf)     Calf Ankle NR ?4.4 NR ?6 Calf - Ankle 14  R Superficial peroneal - Ankle     Lat leg Ankle NR ?4.4 NR ?6 Lat leg - Ankle 14  L Superficial peroneal - Ankle     Lat leg Ankle NR ?4.4 NR ?6 Lat leg - Ankle 14  R Median - Orthodromic (Dig II, Mid palm)     Dig II Wrist NR ?3.4 NR ?10 Dig II - Wrist 13  R Ulnar - Orthodromic, (Dig V, Mid palm)     Dig V Wrist 3.5 ?3.1 2 ?5 Dig V - Wrist 65                   F  Wave    Nerve F Lat Ref.   ms ms  R Ulnar - ADM 39.3 ?32.0       EMG full       EMG Summary Table    Spontaneous MUAP Recruitment  Muscle IA Fib PSW Fasc Other Amp Dur. Poly Pattern  R. Abductor pollicis brevis Increased None None None _______ Normal Normal Normal Reduced  R. First dorsal interosseous Normal None None None _______ Normal Normal Normal Reduced  R. Pronator teres Normal None None None _______ Normal Normal Normal Normal  R. Biceps brachii Normal None None None _______ Normal Normal Normal Normal  R. Deltoid Normal None None None _______ Normal Normal Normal Normal  R. Triceps brachii Normal None None None _______ Normal Normal Normal Normal  R. Cervical paraspinals Normal None None None _______ Normal Normal Normal Normal  R. Tibialis anterior Increased None None None _______ Normal Normal Normal Reduced  R. Tibialis posterior Normal None None None _______ Normal Normal Normal Reduced  R. Peroneus longus Normal None None None _______ Normal Normal Normal Reduced  R. Vastus lateralis Normal None None None _______ Normal Normal Normal Normal  L. Tibialis anterior Increased 3+ None None _______  Increased Increased 2+ Reduced  L. Tibialis posterior Increased None None None _______ Normal Normal Normal Reduced  L. Peroneus longus Increased None None None _______ Normal Normal Normal Reduced  L. Vastus lateralis Normal None None None _______ Normal  Normal Normal Normal  L. Lumbar paraspinals (mid) Normal 1+ 1+ None _______ Normal Normal Normal Normal  L. Lumbar paraspinals (low) Normal 1+ 1+ None _______ Normal Normal Normal Normal  R. Lumbar paraspinals (mid) Increased None None None _______ Normal Normal Normal Normal  R. Lumbar paraspinals (low) Increased None None None _______ Normal Normal Normal Normal

## 2017-03-26 ENCOUNTER — Encounter: Payer: Self-pay | Admitting: *Deleted

## 2017-03-26 ENCOUNTER — Telehealth: Payer: Self-pay | Admitting: Neurology

## 2017-03-26 LAB — VITAMIN D 25 HYDROXY (VIT D DEFICIENCY, FRACTURES): Vit D, 25-Hydroxy: 14.2 ng/mL — ABNORMAL LOW (ref 30.0–100.0)

## 2017-03-26 LAB — FOLATE

## 2017-03-26 LAB — IMMUNOFIXATION ELECTROPHORESIS
IgA/Immunoglobulin A, Serum: 195 mg/dL (ref 61–437)
IgG (Immunoglobin G), Serum: 1067 mg/dL (ref 700–1600)
IgM (Immunoglobulin M), Srm: 60 mg/dL (ref 20–172)
Total Protein: 7.4 g/dL (ref 6.0–8.5)

## 2017-03-26 LAB — COPPER, SERUM: COPPER: 98 ug/dL (ref 72–166)

## 2017-03-26 LAB — ANA W/REFLEX IF POSITIVE: Anti Nuclear Antibody(ANA): NEGATIVE

## 2017-03-26 LAB — RPR: RPR: NONREACTIVE

## 2017-03-26 LAB — C-REACTIVE PROTEIN: CRP: <0.3 mg/L (ref 0.0–4.9)

## 2017-03-26 LAB — VITAMIN B12: Vitamin B-12: 646 pg/mL (ref 232–1245)

## 2017-03-26 LAB — TSH: TSH: 1.58 u[IU]/mL (ref 0.450–4.500)

## 2017-03-26 LAB — CK: CK TOTAL: 119 U/L (ref 24–204)

## 2017-03-26 NOTE — Telephone Encounter (Signed)
Left patient a detailed message, with results and vitamin recommendations, on voicemail (ok per DPR).  Provided our number to call back with any questions.

## 2017-03-26 NOTE — Telephone Encounter (Signed)
Please call patient, extensive laboratory evaluation showed low vitamin D 14, he should take vitamin D3 supplements 2000 units daily.  Rest of the laboratory evaluation showed no significant abnormalities.

## 2017-05-21 ENCOUNTER — Ambulatory Visit (INDEPENDENT_AMBULATORY_CARE_PROVIDER_SITE_OTHER): Payer: Commercial Managed Care - PPO | Admitting: Orthopaedic Surgery

## 2017-05-21 ENCOUNTER — Encounter (INDEPENDENT_AMBULATORY_CARE_PROVIDER_SITE_OTHER): Payer: Self-pay | Admitting: Orthopaedic Surgery

## 2017-05-21 DIAGNOSIS — M25551 Pain in right hip: Secondary | ICD-10-CM

## 2017-05-21 MED ORDER — LIDOCAINE HCL 1 % IJ SOLN
3.0000 mL | INTRAMUSCULAR | Status: AC | PRN
  Administered 2017-05-21: 3 mL

## 2017-05-21 MED ORDER — METHYLPREDNISOLONE ACETATE 40 MG/ML IJ SUSP
40.0000 mg | INTRAMUSCULAR | Status: AC | PRN
  Administered 2017-05-21: 40 mg via INTRA_ARTICULAR

## 2017-05-21 MED ORDER — BUPIVACAINE HCL 0.5 % IJ SOLN
3.0000 mL | INTRAMUSCULAR | Status: AC | PRN
  Administered 2017-05-21: 3 mL via INTRA_ARTICULAR

## 2017-05-21 NOTE — Progress Notes (Signed)
Office Visit Note   Patient: Kenneth KohutLarry F Thompson           Date of Birth: 12/12/1950           MRN: [id number] Visit Date: 05/21/2017              Requested by: Maurice SmallGriffin, Elaine, MD [address] [place], [place] [address] PCP: Maurice SmallGriffin, Elaine, MD   Assessment & Plan: Visit Diagnoses:  1. Pain in right hip     Plan: Impression is right hip pain trochanteric bursitis versus abductor tendinosis.  Injection was performed today with good relief during the anesthetic phase.  Patient was instructed to follow-up if he does not improve from this injection.  May need to consider advanced imaging. Total face to face encounter time was greater than 25 minutes and over half of this time was spent in counseling and/or coordination of care.  Follow-Up Instructions: Return if symptoms worsen or fail to improve.   Orders:  No orders of the defined types were placed in this encounter.  No orders of the defined types were placed in this encounter.     Procedures: Large Joint Inj: R greater trochanter on 05/21/2017 11:26 AM Indications: pain Details: 22 G needle  Arthrogram: No  Medications: 3 mL lidocaine 1 %; 3 mL bupivacaine 0.5 %; 40 mg methylPREDNISolone acetate 40 MG/ML Patient was prepped and draped in the usual sterile fashion.       Clinical Data: No additional findings.   Subjective: Chief Complaint  Patient presents with  . Right Hip - Pain    Patient is here for right hip pain.  Pain is localized to the lateral aspect.  Denies any groin pain or radicular symptoms.  He underwent lumbar decompression and interbody fusion with Dr. Nundkumar 7 months ago.  He states he has pain when he is exercising and when he sleeps on his right side.  Denies any numbness and tingling.    Review of Systems  Constitutional: Negative.   All other systems reviewed and are negative.    Objective: Vital Signs: There were no vitals taken for this visit.  Physical Exam    Constitutional: He is oriented to person, place, and time. He appears well-developed and well-nourished.  Pulmonary/Chest: Effort normal.  Abdominal: Soft.  Neurological: He is alert and oriented to person, place, and time.  Skin: Skin is warm.  Psychiatric: He has a normal mood and affect. His behavior is normal. Judgment and thought content normal.  Nursing note and vitals reviewed.   Ortho Exam Right hip is tender over the trochanteric bursa.  Painless rotation of the hip.  Negative straight leg. Specialty Comments:  No specialty comments available.  Imaging: No results found.   PMFS History: Patient Active Problem List   Diagnosis Date Noted  . Neuropathy, lower extremity 11/10/2016  . Hypoglycemia 10/11/2016  . Lumbar radiculopathy 10/06/2016  . 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 Disturbance of skin sensation 01/15/2013  . Abnormality of gait 01/15/2013   Past Medical History:  Diagnosis Date  . Anxiety   . Arthritis   . Diabetes (HCC)   . High cholesterol   . Neuropathic pain of lower extremity   . Sleep apnea    CPAP     over 5 years ago   does not know where done    Family History  Problem Relation Age of Onset  . Cancer Mother   . Cancer Father  Past Surgical History:  Procedure Laterality Date  . APPLICATION OF ROBOTIC ASSISTANCE FOR SPINAL PROCEDURE N/A 10/06/2016   Procedure: APPLICATION OF ROBOTIC ASSISTANCE FOR SPINAL PROCEDURE;  Surgeon: Lisbeth RenshawNundkumar, Neelesh, MD;  Location: [facility] OR;  Service: Neurosurgery;  Laterality: N/A;  . Back Surgeries  2012   Total 3 back sirgeries  . BACK SURGERY     Spinal AV fistula  . EYE SURGERY Bilateral 2017   cataracts removed  . Right foot/ ankle reconstruction     Social History   Occupational History    Employer: [employer]  Tobacco Use  . Smoking status: Former Smoker    Packs/day: 0.25    Years: 35.00    Pack years: 8.75    Last attempt  to quit: 10/06/2016    Years since quitting: 0.6  . Smokeless tobacco: Never Used  Substance and Sexual Activity  . Alcohol use: No    Alcohol/week: 0.0 oz    Comment: quit 4-5 months ago  . Drug use: No  . Sexual activity: Not on file

## 2017-06-29 ENCOUNTER — Ambulatory Visit (INDEPENDENT_AMBULATORY_CARE_PROVIDER_SITE_OTHER): Payer: Commercial Managed Care - PPO | Admitting: Orthopaedic Surgery

## 2017-06-29 ENCOUNTER — Encounter (INDEPENDENT_AMBULATORY_CARE_PROVIDER_SITE_OTHER): Payer: Self-pay | Admitting: Orthopaedic Surgery

## 2017-06-29 DIAGNOSIS — M7061 Trochanteric bursitis, right hip: Secondary | ICD-10-CM | POA: Diagnosis not present

## 2017-06-29 MED ORDER — BUPIVACAINE HCL 0.5 % IJ SOLN
3.0000 mL | INTRAMUSCULAR | Status: AC | PRN
  Administered 2017-06-29: 3 mL via INTRA_ARTICULAR

## 2017-06-29 MED ORDER — LIDOCAINE HCL 1 % IJ SOLN
3.0000 mL | INTRAMUSCULAR | Status: AC | PRN
  Administered 2017-06-29: 3 mL

## 2017-06-29 MED ORDER — METHYLPREDNISOLONE ACETATE 40 MG/ML IJ SUSP
40.0000 mg | INTRAMUSCULAR | Status: AC | PRN
  Administered 2017-06-29: 40 mg via INTRA_ARTICULAR

## 2017-06-29 NOTE — Progress Notes (Signed)
Office Visit Note   Patient: Kenneth Thompson           Date of Birth: 02-Jan-1951           MRN: [id number] Visit Date: 06/29/2017              Requested by: [name], MD [address] [place], [place] [address] PCP: [name], MD   Assessment & Plan: Visit Diagnoses:  1. Trochanteric bursitis, right hip     Plan: Impression is persistent right trochanteric bursitis.  Injection was performed today.  Referral for physical therapy with modalities.  Questions encouraged and answered.  I did review the previous MRI of his hip from last year which did not show any degenerative joint disease of his hip or any significant tendinosis of his abductors.  Follow-up as needed.  We did briefly discuss that if this continues to be a problem we could consider surgical release of the IT band.  Follow-Up Instructions: Return if symptoms worsen or fail to improve.   Orders:  No orders of the defined types were placed in this encounter.  No orders of the defined types were placed in this encounter.     Procedures: Large Joint Inj: R greater trochanter on 06/29/2017 10:52 AM Indications: pain Details: 22 G needle  Arthrogram: No  Medications: 3 mL lidocaine 1 %; 3 mL bupivacaine 0.5 %; 40 mg methylPREDNISolone acetate 40 MG/ML Patient was prepped and draped in the usual sterile fashion.       Clinical Data: No additional findings.   Subjective: Chief Complaint  Patient presents with  . Right Hip - Pain  . Right Leg - Pain    Harsha comes back today for right lateral hip pain.  This has been ongoing for about a week with intense pain.  Denies any groin pain.    Review of Systems  Constitutional: Negative.   All other systems reviewed and are negative.    Objective: Vital Signs: There were no vitals taken for this visit.  Physical Exam  Constitutional: He is oriented to person, place, and time. He appears well-developed and well-nourished.    Pulmonary/Chest: Effort normal.  Abdominal: Soft.  Neurological: He is alert and oriented to person, place, and time.  Skin: Skin is warm.  Psychiatric: He has a normal mood and affect. His behavior is normal. Judgment and thought content normal.  Nursing note and vitals reviewed.   Ortho Exam Right hip exam shows point tenderness over the trochanteric bursa.  No pain with hip rotation. Specialty Comments:  No specialty comments available.  Imaging: No results found.   PMFS History: Patient Active Problem List   Diagnosis Date Noted  . Neuropathy, lower extremity 11/10/2016  . Hypoglycemia 10/11/2016  . Lumbar radiculopathy 10/06/2016  . 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 Disturbance of skin sensation 01/15/2013  . Abnormality of gait 01/15/2013   Past Medical History:  Diagnosis Date  . Anxiety   . Arthritis   . Diabetes (HCC)   . High cholesterol   . Neuropathic pain of lower extremity   . Sleep apnea    CPAP     over 5 years ago   does not know where done    Family History  Problem Relation Age of Onset  . Cancer Mother   . Cancer Father     Past Surgical History:  Procedure Laterality Date  . APPLICATION OF ROBOTIC ASSISTANCE FOR SPINAL PROCEDURE  N/A 10/06/2016   Procedure: APPLICATION OF ROBOTIC ASSISTANCE FOR SPINAL PROCEDURE;  Surgeon: Lisbeth RenshawNundkumar, Neelesh, MD;  Location: [facility] OR;  Service: Neurosurgery;  Laterality: N/A;  . Back Surgeries  2012   Total 3 back sirgeries  . BACK SURGERY     Spinal AV fistula  . EYE SURGERY Bilateral 2017   cataracts removed  . Right foot/ ankle reconstruction     Social History   Occupational History    Employer: [employer]  Tobacco Use  . Smoking status: Former Smoker    Packs/day: 0.25    Years: 35.00    Pack years: 8.75    Last attempt to quit: 10/06/2016    Years since quitting: 0.7  . Smokeless tobacco: Never Used  Substance and Sexual Activity   . Alcohol use: No    Alcohol/week: 0.0 oz    Comment: quit 4-5 months ago  . Drug use: No  . Sexual activity: Not on file

## 2017-07-03 ENCOUNTER — Telehealth (INDEPENDENT_AMBULATORY_CARE_PROVIDER_SITE_OTHER): Payer: Self-pay | Admitting: *Deleted

## 2017-07-03 NOTE — Telephone Encounter (Signed)
Received fax from PT [facility] [facility] advising of appt scheduled on 07/04/17 at 9am. Pt aware of appt

## 2017-09-03 ENCOUNTER — Telehealth: Payer: Self-pay | Admitting: Neurology

## 2017-09-03 MED ORDER — OXCARBAZEPINE 150 MG PO TABS: ORAL_TABLET | ORAL | 11 refills | Status: DC

## 2017-09-03 NOTE — Telephone Encounter (Signed)
Pt requesting a call to discuss going on a new medication. Stating that his feet having being causing him more pain and discomfort.

## 2017-09-03 NOTE — Telephone Encounter (Signed)
States he has been having significant problems with hypertension, despite changes in his blood pressure medications. Says he is being closely followed by his PCP.  Says his feet hurt worse when his BP is high.  He has history of back surgery with the most recent being May 2018.  He has continued to have problems with walking from this surgery and he restarted PT 6-7 weeks ago.  He is taking the following from Dr. Terrace ArabiaYan:  1) Trileptal 150mg , 2 tabs BID 2) Baclofen 10mg , 4 tablets TID 3) Gabapentin 600mg , 3 caps am, 2 caps midday, 3 caps qhs

## 2017-09-03 NOTE — Telephone Encounter (Signed)
Per vo by Dr. Yan, increasTerrace Arabiae dosage of Trileptal to 150mg , taking 2.5 tablets BID.  Patient agreeable to this plan.  His July appt has been moved to an earlier date in May.  New rx for dosage change has been sent to the pharmacy.

## 2017-10-08 ENCOUNTER — Encounter

## 2017-10-08 ENCOUNTER — Encounter: Payer: Self-pay | Admitting: Neurology

## 2017-10-08 ENCOUNTER — Ambulatory Visit (INDEPENDENT_AMBULATORY_CARE_PROVIDER_SITE_OTHER): Payer: Commercial Managed Care - PPO | Admitting: Neurology

## 2017-10-08 VITALS — BP 119/72 | HR 62 | Ht 76.0 in | Wt 289.0 lb

## 2017-10-08 DIAGNOSIS — M5416 Radiculopathy, lumbar region: Secondary | ICD-10-CM | POA: Diagnosis not present

## 2017-10-08 DIAGNOSIS — M5104 Intervertebral disc disorders with myelopathy, thoracic region: Secondary | ICD-10-CM | POA: Diagnosis not present

## 2017-10-08 DIAGNOSIS — G5793 Unspecified mononeuropathy of bilateral lower limbs: Secondary | ICD-10-CM

## 2017-10-08 NOTE — Progress Notes (Signed)
[facility]  PATIENT: Kenneth Thompson DOB: [birth year]   REASON FOR VISIT: Follow-up for neuropathy , Gait abnormality  HISTORY FROM: Patient and wife    HISTORY OF PRESENT ILLNESS:Kenneth Thompson is a 67 years old male follow up for gait difficulty following his thoracic AVM intravascular embolic surgery at [facility] in 2012  He had gradual onset gait difficulty following his right ankle fracture in 2009, eventually was diagnosed with thoracic AVM by MRI findings.  MRI thoracic spine showed T5-T11, there is intramedullary spinal cord edema, with enhancing intradural dilated venous plexus posteriorly. 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  He had angiogram by Dr. Launa Flight, angiographically no evidence of early arteriovenous shunting in the spinous axis from the craniovertebral junction to the lumbosacral region noted. No abnormal early prominent venous channels are seen in the midline or in the paramidline region of the cranial spinous axis.   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 Arlyss Queen, postsurgically, he can ambulate better, but he gradually developed bilateral lower extremity burning achy pain, bandlike sensation around his lower abdomen, urinary urgency   He also has history of hypertension, diabetes, obesity,  He is currently taking Neurontin 600 mg 2 tablets 3 times a day, baclofen 10 mg 3 times a day, he complains of gradual worsening eye lateral lower extremity achy pain, like he is walking on gravels, gait difficulty, urinary urgency, bowel urgency, occasionally incontinence. He has tried nortriptyline, up to 20 mg daily, not sure about the benefit   He has been self dosing him with titrating dose of ibuprofen, up to 4800 mg daily, tends to spend a lot of time raising his leg up, or sleeping in bed,  We have reviewed MRI thoracic in April 2015, Posterior  surgical decompression changes at T5 level. Small, serpiginous flow void signals noted in the posterior intra-dural extramedullary CSF space, from T3 to T8, consistent with spinal AVM. No intrinsic, compressive or abnormal enhancing spinal cord lesions. Compared to MRI from 08/10/10, spinal cord edema has resolved, fewer dilated blood vessels from AVM are seen, and post-surgical changes are a new finding.  MRI of lumbar spine showed multilevel degenerative disc disease, no significant foraminal, or canal stenosis.  UPDATE January 07 2015:[name] complains of constant bilateral lower extremity achy pain, he has stopped daily ibuprofen use, Celebrex 100 mg twice a day does not help, He is also taking gabapentin 1200 mg 3 times a day, Trileptal 150 mg twice a day has helpful, he is also taking baclofen 10 mg 2 tablets 3 times a day, wife reported he tends to drink alcohol before he goes to bed,   He uses CPAP machine at night, which has him sleep better.  Update May 18 2015:YY His balance is gradaully getting worse, he also complains of pain at the bottom of his feet, like walking on pebbles, he woke up frequently at night time, occasionally nocturnal urinary incontinence, daytime urinary frequency, hesitation,  He is now taking gabaentin up to 3600 mg daily, Oxtellar  iii bid and baclofen  tid.   UPDATE June 27th 2017:[name] has a lot of right hip pain, low back pain, bone pain, hurt after he walks for a while, back pain when he bending over, he can only walk 15-20 minutes, he is not active, he has not swim for 2 years,  Bilateral feet feel like in fire, burning and cold sometimes.  He is now taking gabaentin up to 3600 mg daily, Oxtellar  iii bid and baclofen  tid.   UPDATE Jan 4th 2018:[name] was seen by orthopedics recently for right hip pain, I personally reviewed x-ray on May 11 2016, there was no significant abnormality, he still has neuropathic pain at bilateral  feet, he is taking gabapentin  bid, oxtellar  iii bid, baclofen  tid,  He sleeps a lot during the day, he has irregular night time sleep schedule.  He reported low sodium recent laboratory evaluations.  UPDATE Sept 10 2018: He had L4-5, L5-S1 laminectomy with facetectomy for decompression of nerve roots, placement of anterior interbody device on Oct 06 2016,  which has helped his low back pain,   Now he has toes curling up, feet and leg numbness, more on left side, use walkers now, urinary urgency, more gait abnormality.  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 There is also superimposed bilateral lumbar radiculopathy, left worse than right, mainly involving bilateral L4, L5, S1 myotomes.  In addition, there is evidence of severe right carpal tunnel syndromes, demyelinating in nature. There is no evidence of right cervical radiculopathy.   He is doing better with physical therapy, to need to be on polypharmacy treatment, complains of drowsiness fatigue 1) Trileptal , 2 tabs BID 2) Baclofen , 4 tablets TID 3) Gabapentin , 3 caps am, 2 caps midday, 3 caps qhs  REVIEW OF SYSTEMS: Full 14 system review of systems performed and notable only for those listed, all others are neg:  Light sensitivity, double vision, loss of vision, blurred vision, heat intolerance, excessive eating, diarrhea, apnea, frequent awakening, frequent urination, urgency, joint pain, swelling, aching muscles, walking difficulty, memory loss, dizziness, numbness, weakness, tremor, agitation, confusion, decreased concentration, nervousness, anxiety  ALLERGIES: No Known Allergies  HOME MEDICATIONS: Outpatient Medications Prior to Visit  Medication Sig Dispense Refill  . aspirin 81 MG tablet Take 81 mg by mouth daily.    Marland Kitchen atenolol (TENORMIN) 50 MG tablet Take 50 mg by  mouth daily.  11  . baclofen (LIORESAL) 10 MG tablet Take 4 tablets (40 mg total) by mouth 3 (three) times daily. 360 tablet 11  . gabapentin (NEURONTIN) 600 MG tablet Take 2 tablets (1,200 mg total) by mouth 4 (four) times daily. 240 tablet 11  . gemfibrozil (LOPID) 600 MG tablet Take 600 mg by mouth 2 (two) times daily.     Marland Kitchen glucose blood (ONE TOUCH ULTRA TEST) test strip Check cbgs 4 times a day 100 each 12  . l-methylfolate-B6-B12 (METANX) 3-35-2 MG TABS tablet Take 1 tablet by mouth daily. 90 tablet 1  . losartan (COZAAR) 100 MG tablet Take 100 mg by mouth daily.     . metFORMIN (GLUCOPHAGE) 500 MG tablet Take 1,000 mg by mouth 2 (two) times daily.     . OXcarbazepine (TRILEPTAL) 150 MG tablet Take 2.5 tablets twice daily. 150 tablet 11  . Cholecalciferol (VITAMIN D3) 2000 units TABS Take 2,000 Units daily by mouth.     No facility-administered medications prior to visit.     PAST MEDICAL HISTORY: Past Medical History:  Diagnosis Date  . Anxiety   . Arthritis   . Diabetes (HCC)   . High cholesterol   . Neuropathic pain of lower extremity   . Sleep apnea    CPAP     over 5 years ago   does  not know where done    PAST SURGICAL HISTORY: Past Surgical History:  Procedure Laterality Date  . APPLICATION OF ROBOTIC ASSISTANCE FOR SPINAL PROCEDURE N/A 10/06/2016   Procedure: APPLICATION OF ROBOTIC ASSISTANCE FOR SPINAL PROCEDURE;  Surgeon: Lisbeth Renshaw, MD;  Location: [facility] OR;  Service: Neurosurgery;  Laterality: N/A;  . Back Surgeries  2012   Total 3 back sirgeries  . BACK SURGERY     Spinal AV fistula  . EYE SURGERY Bilateral 2017   cataracts removed  . Right foot/ ankle reconstruction      FAMILY HISTORY: Family History  Problem Relation Age of Onset  . Cancer Mother   . Cancer Father     SOCIAL HISTORY: Social History   Socioeconomic History  . Marital status: Married    Spouse name: Lupita Leash  . Number of children: 2  . Years of education: 44  . Highest  education level: Not on file  Occupational History    Employer: [employer]  Social Needs  . Financial resource strain: Not on file  . Food insecurity:    Worry: Not on file    Inability: Not on file  . Transportation needs:    Medical: Not on file    Non-medical: Not on file  Tobacco Use  . Smoking status: Former Smoker    Packs/day: 0.25    Years: 35.00    Pack years: 8.75    Last attempt to quit: 10/06/2016    Years since quitting: 1.0  . Smokeless tobacco: Never Used  Substance and Sexual Activity  . Alcohol use: No    Alcohol/week: 0.0 oz    Comment: quit 4-5 months ago  . Drug use: No  . Sexual activity: Not on file  Lifestyle  . Physical activity:    Days per week: Not on file    Minutes per session: Not on file  . Stress: Not on file  Relationships  . Social connections:    Talks on phone: Not on file    Gets together: Not on file    Attends religious service: Not on file    Active member of club or organization: Not on file    Attends meetings of clubs or organizations: Not on file    Relationship status: Not on file  . Intimate partner violence:    Fear of current or ex partner: Not on file    Emotionally abused: Not on file    Physically abused: Not on file    Forced sexual activity: Not on file  Other Topics Concern  . Not on file  Social History Narrative   Patient is married and lives at home with wife.    Patient has a Barista.   Patient drinks 6-8 cups of caffeine daily.    Right-handed.        PHYSICAL EXAM  Vitals:   10/08/17 1107  BP: 119/72  Pulse: 62  Weight: 289 lb (131.1 kg)  Height:  (1.93 m)   Body mass index is 35.18 kg/m.  Generalized: Well developed,Obese male in no acute distress  Head: normocephalic and atraumatic,. Oropharynx benign  Neck: Supple,   Musculoskeletal: No deformity   Neurological examination   Mentation: Alert oriented to time, place, history taking. Attention span and  concentration appropriate. Recent and remote memory intact.  Follows all commands speech and language fluent.   Cranial nerve II-XII: Pupils were equal round reactive to light extraocular movements were full, visual field were  full on confrontational test. Facial sensation and strength were normal. hearing was intact to finger rubbing bilaterally. Uvula tongue midline. head turning and shoulder shrug were normal and symmetric.Tongue protrusion into cheek strength was normal.  Motor:There is no pronator drift of out-stretched arms. He has mild bilateral ankle plantar flexion weakness, left worse than right  Sensory: Length dependent decreased Light touch, pinprick to distal legs decreased vibration sense to knee level  Coordination: Rapid alternating movements and fine finger movements are intact. limited at heel-to-shin due to spasticity  Reflexes: Brachioradialis 2/2, biceps 2/2, triceps 2/2, patellar 3/3, Achilles absent, plantar responses were flexor bilaterally Gait and Station: He needs to push up to get up from seated position, dragging left leg, rely on his cane, cautious, unsteady  DIAGNOSTIC DATA (LABS, IMAGING, TESTING) - I reviewed patient records, labs, notes, testing and imaging myself where available.  Lab Results  Component Value Date   WBC 9.0 10/11/2016   HGB 11.2 (L) 10/11/2016   HCT 32.6 (L) 10/11/2016   MCV 90.1 10/11/2016   PLT PLATELETS APPEAR ADEQUATE 10/11/2016      Component Value Date/Time   NA 126 (L) 11/10/2016 0952   K 5.2 11/10/2016 0952   CL 90 (L) 11/10/2016 0952   CO2 19 (L) 11/10/2016 0952   GLUCOSE 105 (H) 11/10/2016 0952   GLUCOSE 132 (H) 10/12/2016 0235   BUN 13 11/10/2016 0952   CREATININE 0.80 11/10/2016 0952   CALCIUM 9.5 11/10/2016 0952   PROT 7.4 03/23/2017 1012   ALBUMIN 3.0 (L) 10/12/2016 0235   AST 30 10/12/2016 0235   ALT 22 10/12/2016 0235   ALKPHOS 49 10/12/2016 0235   BILITOT 0.4 10/12/2016 0235   GFRNONAA 94 11/10/2016 0952    GFRAA 108 11/10/2016 0952    Lab Results  Component Value Date   HGBA1C 5.6 09/28/2016    ASSESSMENT AND PLAN  67 y.o. year old male   History of T5-T11 AVM status post resection 2012.  Mild residual spastic paraplegia,  . Bilateral lower extremity neuropathic pain, polypharmacy treatment Trileptal , 2 tabs BID  Baclofen , 4 tablets TID  Gabapentin , 3 caps am, 2 caps midday, 3 caps qhs  May considering tapering down gabapentin to 3600 mg daily and lower dose of baclofen.  Low back pain  S/p decompression, worsening left leg pain,   Continue physical for pain  Levert Feinstein, M.D. Ph.D.  [facility] [address] [place], [place] [address] Phone: [phone] Fax:      [phone]

## 2017-11-19 ENCOUNTER — Ambulatory Visit: Payer: Commercial Managed Care - PPO | Admitting: Neurology

## 2018-01-04 ENCOUNTER — Other Ambulatory Visit: Payer: Self-pay | Admitting: Neurology

## 2018-01-09 ENCOUNTER — Ambulatory Visit (INDEPENDENT_AMBULATORY_CARE_PROVIDER_SITE_OTHER): Payer: Commercial Managed Care - PPO | Admitting: Orthopaedic Surgery

## 2018-01-09 ENCOUNTER — Encounter (INDEPENDENT_AMBULATORY_CARE_PROVIDER_SITE_OTHER): Payer: Self-pay | Admitting: Orthopaedic Surgery

## 2018-01-09 DIAGNOSIS — M7061 Trochanteric bursitis, right hip: Secondary | ICD-10-CM | POA: Diagnosis not present

## 2018-01-09 MED ORDER — LIDOCAINE HCL 1 % IJ SOLN
3.0000 mL | INTRAMUSCULAR | Status: AC | PRN
  Administered 2018-01-09: 3 mL

## 2018-01-09 MED ORDER — BUPIVACAINE HCL 0.5 % IJ SOLN
3.0000 mL | INTRAMUSCULAR | Status: AC | PRN
  Administered 2018-01-09: 3 mL via INTRA_ARTICULAR

## 2018-01-09 MED ORDER — METHYLPREDNISOLONE ACETATE 40 MG/ML IJ SUSP
40.0000 mg | INTRAMUSCULAR | Status: AC | PRN
  Administered 2018-01-09: 40 mg via INTRA_ARTICULAR

## 2018-01-09 NOTE — Progress Notes (Signed)
Office Visit Note   Patient: Kenneth Thompson           Date of Birth: 10/28/1950           MRN: [id number] Visit Date: 01/09/2018              Requested by: Maurice SmallGriffin, Elaine, MD [address] [place], [place] [address] PCP: Maurice SmallGriffin, Elaine, MD   Assessment & Plan: Visit Diagnoses:  1. Trochanteric bursitis, right hip     Plan: Impression is right hip trochanteric bursitis versus abductor tendinosis from abnormal gait related to his chronic back issues.  Cortisone injection performed today which gave immediate anesthetic relief.  Patient tolerates well.  Follow-up as needed.  Follow-Up Instructions: Return if symptoms worsen or fail to improve.   Orders:  No orders of the defined types were placed in this encounter.  No orders of the defined types were placed in this encounter.     Procedures: Large Joint Inj: R greater trochanter on 01/09/2018 3:57 PM Indications: pain Details: 22 G needle  Arthrogram: No  Medications: 3 mL lidocaine 1 %; 3 mL bupivacaine 0.5 %; 40 mg methylPREDNISolone acetate 40 MG/ML Patient was prepped and draped in the usual sterile fashion.       Clinical Data: No additional findings.   Subjective: Chief Complaint  Patient presents with  . Right Hip - Pain    Kenneth Thompson comes in today for right hip that is worse with ambulation and using stairs.  Is worse with left side.  Denies any numbness or tingling.  Denies any numbness.  He walks with a walking stick.   Review of Systems  Constitutional: Negative.   All other systems reviewed and are negative.    Objective: Vital Signs: There were no vitals taken for this visit.  Physical Exam  Constitutional: He is oriented to person, place, and time. He appears well-developed and well-nourished.  Pulmonary/Chest: Effort normal.  Abdominal: Soft.  Neurological: He is alert and oriented to person, place, and time.  Skin: Skin is warm.  Psychiatric: He has a normal mood and  affect. His behavior is normal. Judgment and thought content normal.  Nursing note and vitals reviewed.   Ortho Exam Right hip exam shows tenderness of the posterior lateral greater trochanter.  Pain with hip.  Negative sciatic tension signs.  No groin pain. Specialty Comments:  No specialty comments available.  Imaging: No results found.   PMFS History: Patient Active Problem List   Diagnosis Date Noted  . Neuropathy, lower extremity 11/10/2016  . Hypoglycemia 10/11/2016  . Lumbar radiculopathy 10/06/2016  . 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 Disturbance of skin sensation 01/15/2013  . Abnormality of gait 01/15/2013   Past Medical History:  Diagnosis Date  . Anxiety   . Arthritis   . Diabetes (HCC)   . High cholesterol   . Neuropathic pain of lower extremity   . Sleep apnea    CPAP     over 5 years ago   does not know where done    Family History  Problem Relation Age of Onset  . Cancer Mother   . Cancer Father     Past Surgical History:  Procedure Laterality Date  . APPLICATION OF ROBOTIC ASSISTANCE FOR SPINAL PROCEDURE N/A 10/06/2016   Procedure: APPLICATION OF ROBOTIC ASSISTANCE FOR SPINAL PROCEDURE;  Surgeon: Lisbeth RenshawNundkumar, Neelesh, MD;  Location: [facility] OR;  Service: Neurosurgery;  Laterality: N/A;  . Back Surgeries  2012   Total 3 back sirgeries  . BACK SURGERY     Spinal AV fistula  . EYE SURGERY Bilateral 2017   cataracts removed  . Right foot/ ankle reconstruction     Social History   Occupational History    Employer: [employer]  Tobacco Use  . Smoking status: Former Smoker    Packs/day: 0.25    Years: 35.00    Pack years: 8.75    Last attempt to quit: 10/06/2016    Years since quitting: 1.2  . Smokeless tobacco: Never Used  Substance and Sexual Activity  . Alcohol use: No    Alcohol/week: 0.0 standard drinks    Comment: quit 4-5 months ago  . Drug use: No  . Sexual activity: Not on  file

## 2018-04-23 NOTE — Progress Notes (Signed)
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 MRI thoracic spine showed T5-T11, there is intramedullary spinal cord edema, with enhancing intradural dilated venous plexus posteriorly. 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 He had angiogram by Dr. Diamantina Thompson, angiographically no evidence of early arteriovenous shunting in the spinous axis from the craniovertebral junction to the lumbosacral region noted. No abnormal early prominent venous channels are seen in the midline or in the paramidline region of the cranial spinous axis.   He underwent thoracic AVM malformation introvascular embolic surgery at [facility] in 2012 by Dr. Francesca Thompson, postsurgically, he can ambulate better, but he gradually developed bilateral lower extremity burning achy pain, bandlike sensation around his lower abdomen, urinary urgency   He also has history of hypertension, diabetes, obesity,  He is currently taking Neurontin 600 mg 2 tablets 3 times a day, baclofen 10 mg 3 times a day, he complains of gradual worsening eye lateral lower extremity achy pain, like he is walking on gravels, gait difficulty, urinary urgency, bowel urgency, occasionally incontinence. He has tried nortriptyline, up to 20 mg daily, not sure about the benefit  He has been self dosing him with titrating dose of ibuprofen, up to 4800 mg daily, tends to spend a lot of time raising his leg up, or sleeping in bed,  We have reviewed MRI thoracic in April 2015,Posterior  surgical decompression changes at T5 level. Small, serpiginous flow void signals noted in the posterior intra-dural extramedullary CSF space, from T3 to T8, consistent with spinal AVM. No intrinsic, compressive or abnormal enhancing spinal cord lesions. Compared to MRI from 08/10/10, spinal cord edema has resolved, fewer dilated blood vessels from AVM are seen, and post-surgical changes are a new finding.  MRI of lumbar spine showed multilevel degenerative disc disease, no significant foraminal, or canal stenosis.  UPDATE January 07 2015:[name] complains of constant bilateral lower extremity achy pain, he has stopped daily ibuprofen use, Celebrex 100 mg twice a day does not help, He is also taking gabapentin 1200 mg 3 times a day, Trileptal 150 mg twice a day has helpful, he is also taking baclofen 10 mg 2 tablets 3 times a day, wife reported he tends to drink alcohol before he goes to bed,  He uses CPAP machine at night, which has him sleep better.  Update May 18 2015:YY His balance is gradaully getting worse, he also complains of pain at the bottom of his feet, like walking on pebbles, he woke up frequently at night time, occasionally nocturnal urinary incontinence, daytime urinary frequency, hesitation,  He is now taking gabaentin up to 3600 mg daily, Oxtellar 150mg  iii bid and baclofen 30mg  tid.   UPDATE June 27th 2017:[name] has a lot of right hip pain, low back pain, bone pain, hurt after he walks for a while, back pain when he bending over, he can only walk 15-20 minutes, he is not active, he has not swim for 2 years, Bilateral feet feel like in fire, burning and cold sometimes.  He is now taking gabaentin up to 3600  mg daily, Oxtellar 150mg  iii bid and baclofen 30mg  tid.   UPDATE Jan 4th 2018:[name] was seen by orthopedics recently for right hip pain,I personally reviewedx-ray on May 11 2016, there wasno significant abnormality, he still has neuropathic pain at bilateral  feet, he is taking gabapentin 1800mg  bid, oxtellar 150mg  iii bid, baclofen 30mg  tid, He sleeps a lot during the day, he has irregular night time sleep schedule. He reported low sodiumrecent laboratory evaluations.  UPDATE Sept 10 2018: He had L4-5, L5-S1 laminectomy with facetectomy for decompression of nerve roots, placement of anterior interbody device on Oct 06 2016,  which has helped his low back pain,   Now he has toes curling up, feet and leg numbness, more on left side, use walkers now, urinary urgency, more gait abnormality.  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 There is also superimposed bilateral lumbar radiculopathy, left worse than right, mainly involving bilateral L4, L5, S1 myotomes.  In addition, there is evidence of severe right carpal tunnel syndromes, demyelinating in nature. There is no evidence of right cervical radiculopathy.   He is doing better with physical therapy, to need to be on polypharmacy treatment, complains of drowsiness fatigue chronic axonal peripheral neuropathy 1) Trileptal 150mg , 2 tabs BID 2) Baclofen 10mg , 4 tablets TID 3) Gabapentin 600mg , 3 caps am, 2 caps midday, 3 caps qhs UPDATE 12/4/2019[name], 67 year old male returns for follow-up with history of chronic axonal peripheral neuropathy and superimposed bilateral lumbar radiculopathy left worse than right involving L4 L5-S1 myotomes.  He also has evidence of severe right carpal tunnel syndrome.  When last seen his medications were reduced and his symptoms remain controlled.  He ambulates with a single-point cane denies any falls.  He exercises on a daily basis, he is a member of a gym.  He returns for reevaluation  REVIEW OF SYSTEMS: Full 14 system review of systems performed and notable only for those listed, all others are neg:  Constitutional: neg    Cardiovascular: neg Ear/Nose/Throat: neg  Skin: neg Eyes: neg Respiratory: neg Gastroitestinal: neg  Hematology/Lymphatic: neg  Endocrine: neg Musculoskeletal:neg Allergy/Immunology: neg Neurological: Numbness weakness Psychiatric: Anxiety Sleep : neg   ALLERGIES: No Known Allergies  HOME MEDICATIONS: Outpatient Medications Prior to Visit  Medication Sig Dispense Refill  . aspirin 81 MG tablet Take 81 mg by mouth daily.    Marland Kitchen atenolol (TENORMIN) 50 MG tablet Take 50 mg by mouth daily.  11  . baclofen (LIORESAL) 10 MG tablet TAKE 4 TABLETS (40 MG TOTAL) BY MOUTH 3 (THREE) TIMES DAILY. (Patient taking differently: Take 20 mg by mouth 3 (three) times daily. ) 360 tablet 11  . gabapentin (NEURONTIN) 600 MG tablet Take 2 tablets (1,200 mg total) by mouth 4 (four) times daily. (Patient taking differently: Take 1,200 mg by mouth 3 (three) times daily. ) 240 tablet 11  . gemfibrozil (LOPID) 600 MG tablet Take 600 mg by mouth daily.     Marland Kitchen glucose blood (ONE TOUCH ULTRA TEST) test strip Check cbgs 4 times a day 100 each 12  . losartan (COZAAR) 100 MG tablet Take 100 mg by mouth daily.     . metFORMIN (GLUCOPHAGE) 500 MG tablet Take 1,000 mg by mouth 2 (two) times daily.     . OXcarbazepine (TRILEPTAL) 150 MG tablet Take 2.5 tablets twice daily. 150 tablet 11  . l-methylfolate-B6-B12 (METANX) 3-35-2 MG TABS tablet  Take 1 tablet by mouth daily. (Patient not taking: Reported on 04/24/2018) 90 tablet 1   No facility-administered medications prior to visit.     PAST MEDICAL HISTORY: Past Medical History:  Diagnosis Date  . Anxiety   . Arthritis   . Diabetes (HCC)   . High cholesterol   . Neuropathic pain of lower extremity   . Sleep apnea    CPAP     over 5 years ago   does not know where done    PAST SURGICAL HISTORY: Past Surgical History:  Procedure Laterality Date  . APPLICATION OF ROBOTIC ASSISTANCE FOR SPINAL PROCEDURE N/A 10/06/2016   Procedure: APPLICATION OF ROBOTIC  ASSISTANCE FOR SPINAL PROCEDURE;  Surgeon: Lisbeth Renshaw, MD;  Location: [facility] OR;  Service: Neurosurgery;  Laterality: N/A;  . Back Surgeries  2012   Total 3 back sirgeries  . BACK SURGERY     Spinal AV fistula  . EYE SURGERY Bilateral 2017   cataracts removed  . Right foot/ ankle reconstruction      FAMILY HISTORY: Family History  Problem Relation Age of Onset  . Cancer Mother   . Cancer Father     SOCIAL HISTORY: Social History   Socioeconomic History  . Marital status: Married    Spouse name: Kenneth Thompson  . Number of children: 2  . Years of education: 55  . Highest education level: Not on file  Occupational History    Employer: [employer]  Social Needs  . Financial resource strain: Not on file  . Food insecurity:    Worry: Not on file    Inability: Not on file  . Transportation needs:    Medical: Not on file    Non-medical: Not on file  Tobacco Use  . Smoking status: Former Smoker    Packs/day: 0.25    Years: 35.00    Pack years: 8.75    Last attempt to quit: 10/06/2016    Years since quitting: 1.5  . Smokeless tobacco: Never Used  Substance and Sexual Activity  . Alcohol use: No    Alcohol/week: 0.0 standard drinks    Comment: quit 4-5 months ago  . Drug use: No  . Sexual activity: Not on file  Lifestyle  . Physical activity:    Days per week: Not on file    Minutes per session: Not on file  . Stress: Not on file  Relationships  . Social connections:    Talks on phone: Not on file    Gets together: Not on file    Attends religious service: Not on file    Active member of club or organization: Not on file    Attends meetings of clubs or organizations: Not on file    Relationship status: Not on file  . Intimate partner violence:    Fear of current or ex partner: Not on file    Emotionally abused: Not on file    Physically abused: Not on file    Forced sexual activity: Not on file  Other Topics Concern  . Not on file  Social History  Narrative   Patient is married and lives at home with wife.    Patient has a Barista.   Patient drinks 6-8 cups of caffeine daily.    Right-handed.        PHYSICAL EXAM  Vitals:   04/24/18 0736  BP: 134/79  Pulse: (!) 59  Weight: 290 lb 12.8 oz (131.9 kg)  Height: 6\' 4"  (  1.93 m)   Body mass index is 35.4 kg/m.  Generalized: Well developed, obese male in no acute distress  Head: normocephalic and atraumatic,. Oropharynx benign  Neck: Supple,   Musculoskeletal: No deformity   Neurological examination   Mentation: Alert oriented to time, place, history taking. Attention span and concentration appropriate. Recent and remote memory intact.  Follows all commands speech and language fluent.   Cranial nerve II-XII: Fundoscopic exam reveals sharp disc margins.Pupils were equal round reactive to light extraocular movements were full, visual field were full on confrontational test. Facial sensation and strength were normal. hearing was intact to finger rubbing bilaterally. Uvula tongue midline. head turning and shoulder shrug were normal and symmetric.Tongue protrusion into cheek strength was normal. Motor: Mild plantarflexion weakness left worse than right otherwise strength within normal limits  Sensory: Length dependent decreased light touch pinprick to distal legs decreased vibration to knee level   Coordination: finger-nose-finger, normal,  limited heel-to-shin due to spasticity  Reflexes: Brachioradialis 2/2, biceps 2/2, triceps 2/2, patellar 3/3, Achilles absent, plantar responses were flexor bilaterally. Gait and Station: Rising up from seated position with push off, ambulates short distances in the hall cautious steady gait relies on his cane   DIAGNOSTIC DATA (LABS, IMAGING, TESTING) - I reviewed patient records, labs, notes, testing and imaging myself where available.  Lab Results  Component Value Date   WBC 9.0 10/11/2016   HGB 11.2 (L) 10/11/2016   HCT 32.6  (L) 10/11/2016   MCV 90.1 10/11/2016   PLT PLATELETS APPEAR ADEQUATE 10/11/2016      Component Value Date/Time   NA 126 (L) 11/10/2016 0952   K 5.2 11/10/2016 0952   CL 90 (L) 11/10/2016 0952   CO2 19 (L) 11/10/2016 0952   GLUCOSE 105 (H) 11/10/2016 0952   GLUCOSE 132 (H) 10/12/2016 0235   BUN 13 11/10/2016 0952   CREATININE 0.80 11/10/2016 0952   CALCIUM 9.5 11/10/2016 0952   PROT 7.4 03/23/2017 1012   ALBUMIN 3.0 (L) 10/12/2016 0235   AST 30 10/12/2016 0235   ALT 22 10/12/2016 0235   ALKPHOS 49 10/12/2016 0235   BILITOT 0.4 10/12/2016 0235   GFRNONAA 94 11/10/2016 0952   GFRAA 108 11/10/2016 0952    Lab Results  Component Value Date   HGBA1C 5.6 09/28/2016   Lab Results  Component Value Date   VITAMINB12 646 03/23/2017   Lab Results  Component Value Date   TSH 1.580 03/23/2017      ASSESSMENT AND PLAN 67 y.o. 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  Bilateral fixed lower extremity neuropathic pain on polypharmacy.  Low back pain status post decompression.  His medications were actually decreased at his last visit with continued control of symptoms.  He is actively enrolled in a gym exercising daily.   Continue Trileptal 150mg  2.5 tabs twice daily Continue Baclofen 10mg  2 tabs three times daily Continue Gabapentin 600mg  2 tabs three times daily Continue exercise program Use cane for safe ambulation F/U in 8  months Nilda RiggsNancy Carolyn Yusuke Beza, [facility] LouisvilleGNP, [facility] IncBC, APRN  [facility] [address], [address] [place], [place] [address] [phone]

## 2018-04-24 ENCOUNTER — Encounter: Payer: Self-pay | Admitting: Nurse Practitioner

## 2018-04-24 ENCOUNTER — Ambulatory Visit: Payer: Commercial Managed Care - PPO | Admitting: Nurse Practitioner

## 2018-04-24 VITALS — BP 134/79 | HR 59 | Ht 76.0 in | Wt 290.8 lb

## 2018-04-24 DIAGNOSIS — G5793 Unspecified mononeuropathy of bilateral lower limbs: Secondary | ICD-10-CM | POA: Diagnosis not present

## 2018-04-24 DIAGNOSIS — R269 Unspecified abnormalities of gait and mobility: Secondary | ICD-10-CM

## 2018-04-24 DIAGNOSIS — M5104 Intervertebral disc disorders with myelopathy, thoracic region: Secondary | ICD-10-CM

## 2018-04-24 DIAGNOSIS — R209 Unspecified disturbances of skin sensation: Secondary | ICD-10-CM | POA: Diagnosis not present

## 2018-04-24 MED ORDER — GABAPENTIN 600 MG PO TABS: 1200.0000 mg | ORAL_TABLET | Freq: Three times a day (TID) | ORAL | 2 refills | Status: DC

## 2018-04-24 NOTE — Progress Notes (Signed)
I have reviewed and agreed above plan. 

## 2018-04-24 NOTE — Patient Instructions (Signed)
Continue Trileptal 150mg  2.5 tabs twice daily Continue Baclofen 10mg  2 tabs three times daily Continue Gabapentin 600mg  2 tabs three times daily Continue exercise program Use cane for safe ambulation F/U in 8  months

## 2018-09-23 ENCOUNTER — Other Ambulatory Visit: Payer: Self-pay | Admitting: Neurology

## 2018-10-30 ENCOUNTER — Telehealth: Payer: Self-pay | Admitting: Nurse Practitioner

## 2018-10-30 MED ORDER — OXCARBAZEPINE 150 MG PO TABS: 300.0000 mg | ORAL_TABLET | Freq: Four times a day (QID) | ORAL | 3 refills | Status: DC

## 2018-10-30 NOTE — Telephone Encounter (Signed)
Pt would like a call from RN to discuss an increase in his medication for his Neuropathy because of his bad pain. Pt is also willing to try another medication,  Please call

## 2018-10-30 NOTE — Telephone Encounter (Signed)
Says he was going to the gym and really doing better.  Unfortunately, his gym has been closed for the last three months.  He has tried to maintain some of his exercise regimen at home but he has still noticed worsening of the pain in his feet. "Feels like I am walking on a bed of nails".  He has started using his cane again.   He is currently taking the following medications:  1) Baclofen 10mg :  2 tabs in am, 2 tabs mid-morning,       2 tabs mid-afternoon, 2 tabs QHS  2) gabapentin 600mg : 2 tabs TID  3) oxcarbazepine 150mg : 2 tabs in am, 1 tab at lunch,     2 tabs QHS  4) hydrocodone 5-325mg :  1 tab daily  He would like to know if any increases or changes can be made to his treatment plan.

## 2018-10-30 NOTE — Telephone Encounter (Signed)
I have called him, he has increased bilateral lower extremity spasticity after stopping exercise.  I have advised him to increase trileptal to 150mg  2 tabs qid.

## 2018-10-30 NOTE — Addendum Note (Signed)
Addended by: Marcial Pacas on: 10/30/2018 04:13 PM   Modules accepted: Orders

## 2018-10-31 ENCOUNTER — Other Ambulatory Visit: Payer: Self-pay | Admitting: *Deleted

## 2018-10-31 MED ORDER — OXCARBAZEPINE 150 MG PO TABS: 300.0000 mg | ORAL_TABLET | Freq: Four times a day (QID) | ORAL | 3 refills | Status: DC

## 2018-12-02 IMAGING — CT CT L SPINE W/O CM
3 of 4 series · 13 of 33 positions shown, 15 images · non-contrast
Comparison: 09/07/2016 MRI

CLINICAL DATA: Spondylolisthesis.  Low back pain.

EXAM:
CT LUMBAR SPINE WITHOUT CONTRAST
TECHNIQUE: Multidetector CT imaging of the lumbar spine was performed without
intravenous contrast administration. Multiplanar CT image
reconstructions were also generated.

[Series 5: l spine 2.0 i40s 3 · axial · 0.32mm/px · z∈[+1124,+1302]mm · 5 of 129 slices shown, 7 images]
[im 20/129  soft-tissue]
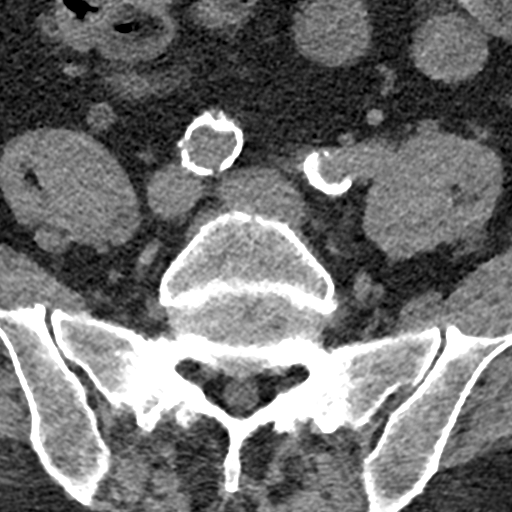
[im 20/129  bone]
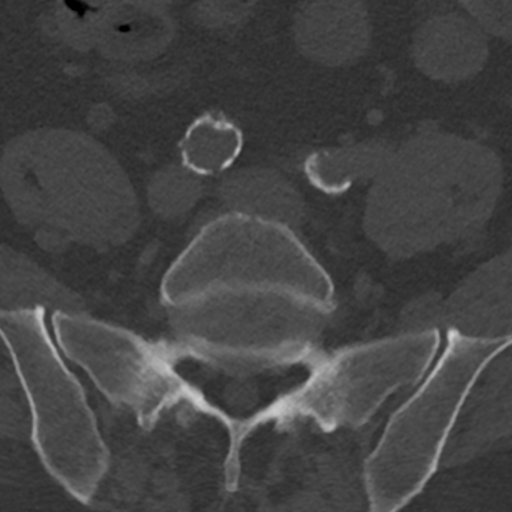
[im 40/129  bone]
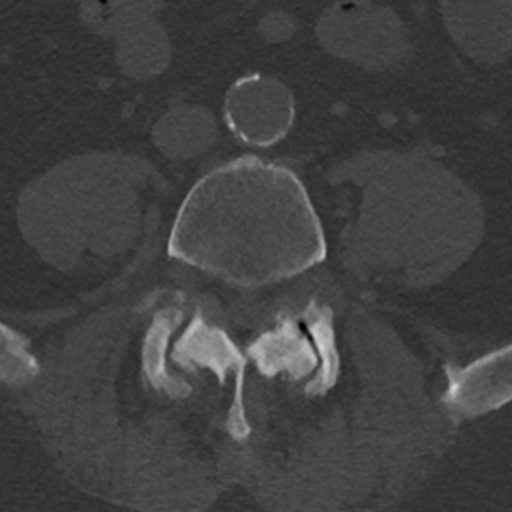
[im 69/129  bone]
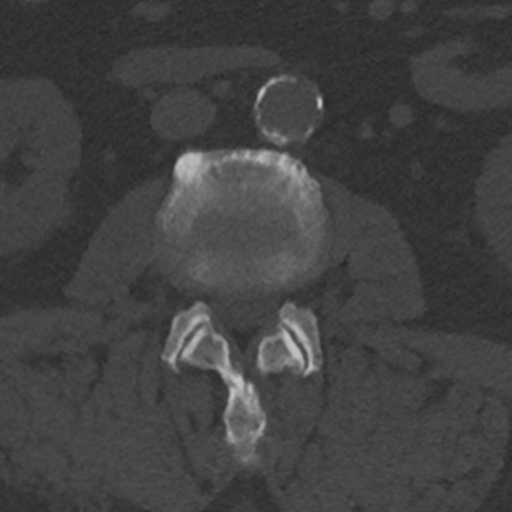
[im 89/129  bone]
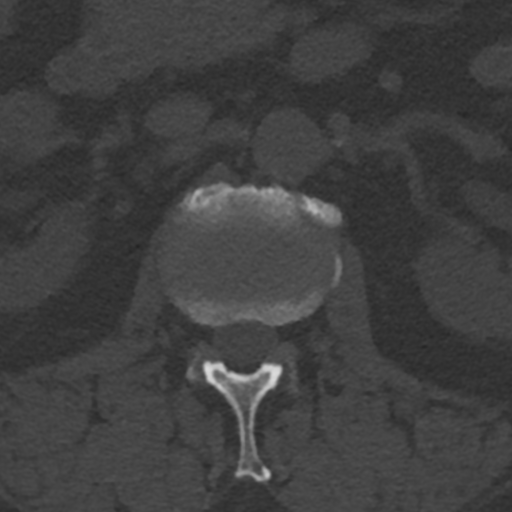
[im 109/129  soft-tissue]
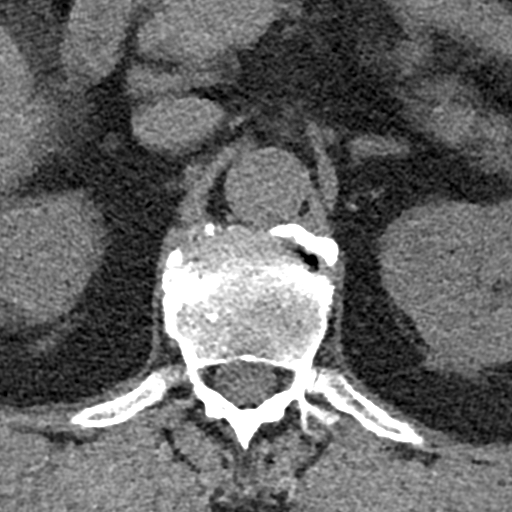
[im 109/129  bone]
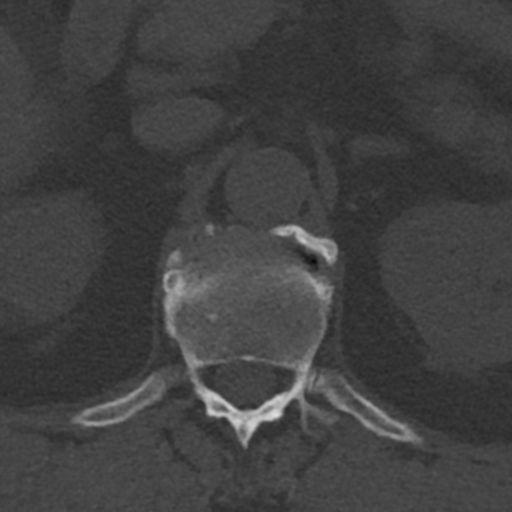

[Series 6: l spine 2.0 mpr sagittal · sagittal · 0.32mm/px · 5 of 83 slices shown]
[im 14/83  bone]
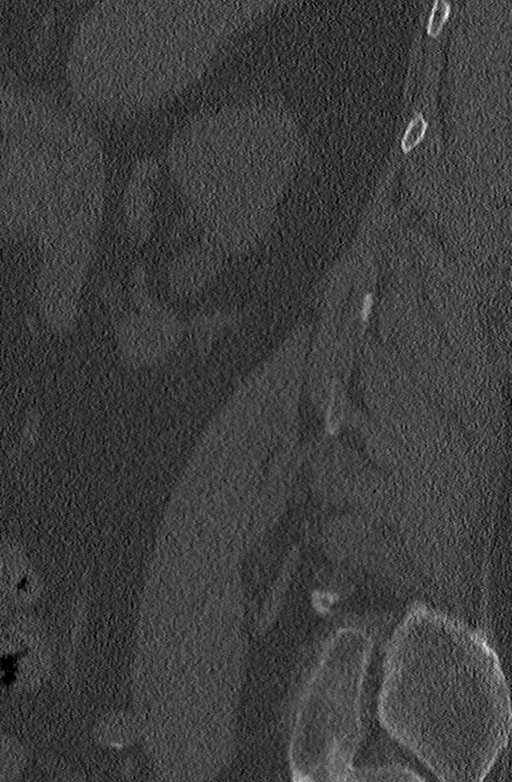
[im 28/83  bone]
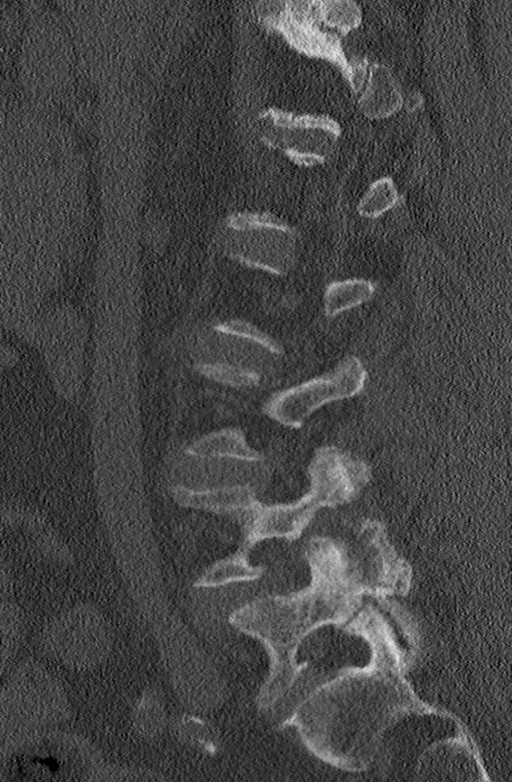
[im 42/83  bone]
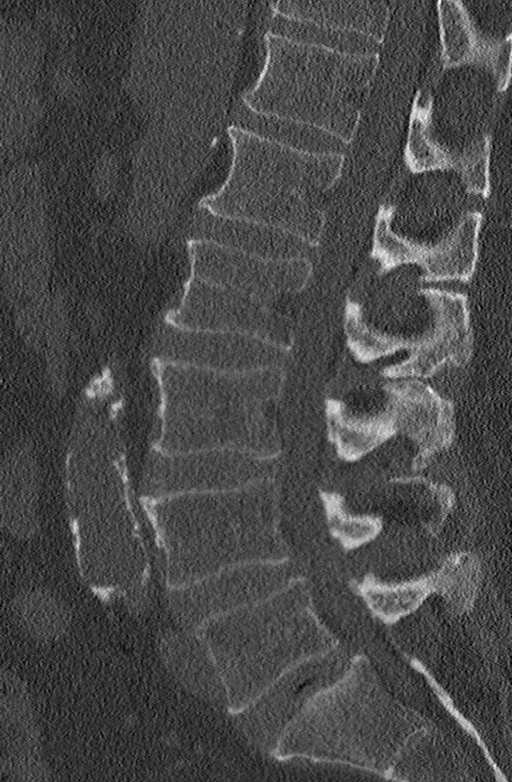
[im 55/83  bone]
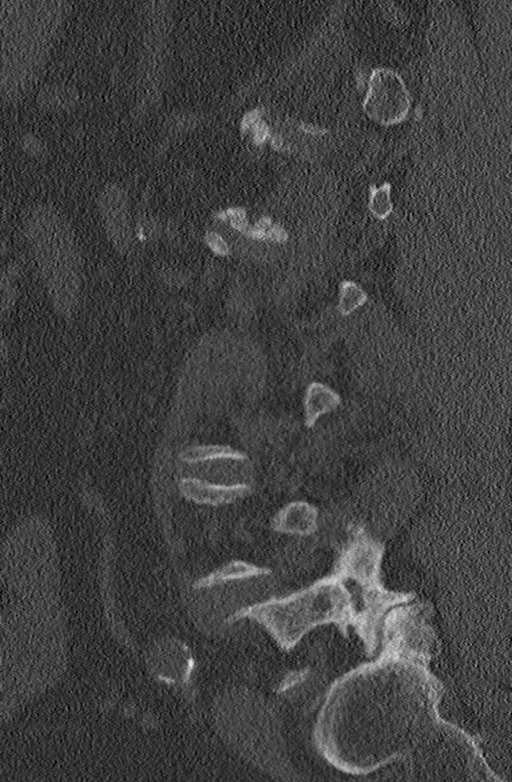
[im 69/83  bone]
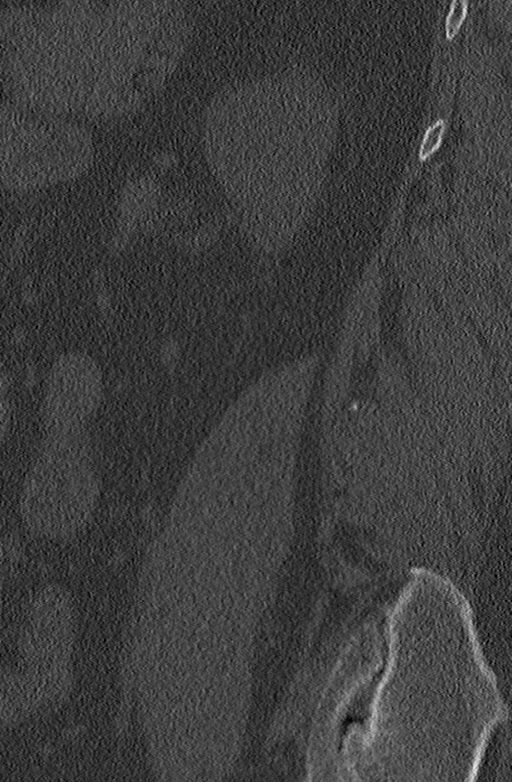

[Series 8: l spine 2.0 mpr coronal · coronal · 0.33mm/px · 3 of 83 slices shown]
[im 17/83  bone]
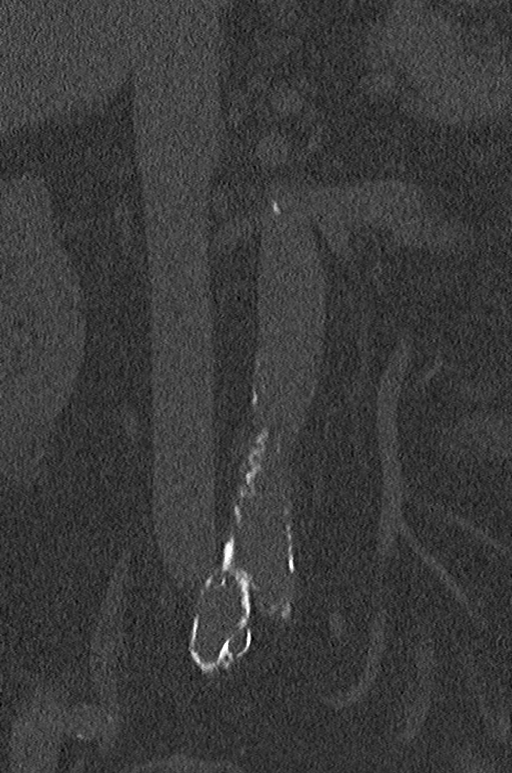
[im 33/83  bone]
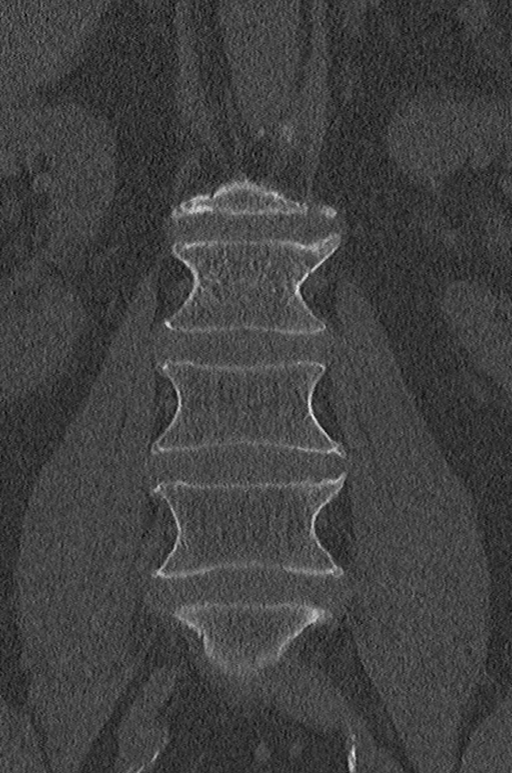
[im 50/83  bone]
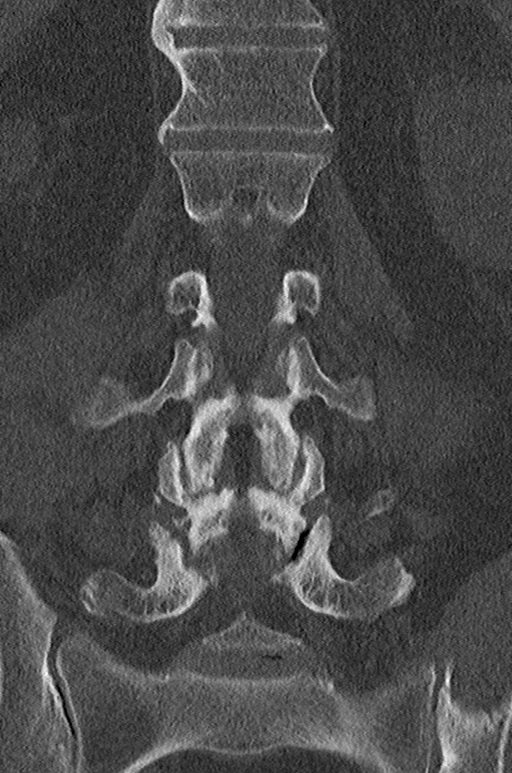

[13 of 33 positions shown; findings below may reference images not displayed]

FINDINGS: Segmentation: 5 lumbar type vertebrae.

Alignment: Grade 1 anterolisthesis at L4-5 and L5-S1, facet
mediated.

Vertebrae: Negative for acute fracture, endplate erosion, or focal
bone lesion. L5 pars defects were questioned on previous lumbar
spine MRI, not present based on this study.

Paraspinal and other soft tissues: Atherosclerosis of the aorta
iliacs. Focal intimal calcium displacement at the right common iliac
artery, which measures up to 2 cm in diameter.

Disc levels:

T12- L1: Spondylosis.  No impingement

L1-L2: Mild spondylosis and degenerative facet spurring. No evidence
of impingement

L2-L3: Spondylosis and mild degenerative facet spurring. No evidence
of impingement

L3-L4: Spondylosis and mild degenerative facet spurring. No evidence
of impingement

L4-L5: Severe facet arthropathy with sclerosis, joint distortion,
and bulky spurring. Grade 1 anterolisthesis. Ligamentum flavum
thickening. Spinal stenosis and bilateral subarticular recess
impingement, better characterized on preceding MRI.

L5-S1:Advanced facet arthropathy with anterolisthesis and bulky
spurring with joint distortion. Grade 1 anterolisthesis. Vacuum
phenomenon within disc fissures.

Coronal reformats have been requested. In the meantime, coronal
reformats were generated on terrarecon.
IMPRESSION: 1. Advanced facet arthropathy at L4-5 and L5-S1 with grade 1
anterolisthesis.
2. L4-5 spinal stenosis and bilateral subarticular recess
impingement.
3. Aortic Atherosclerosis ([id number]). Evidence of prior focal
dissection of the right common iliac artery.

## 2018-12-25 ENCOUNTER — Other Ambulatory Visit: Payer: Self-pay | Admitting: *Deleted

## 2018-12-25 NOTE — Telephone Encounter (Signed)
Has appt 01-02-19 will address then.

## 2018-12-30 ENCOUNTER — Telehealth: Payer: Self-pay | Admitting: Nurse Practitioner

## 2018-12-30 MED ORDER — GABAPENTIN 600 MG PO TABS: 1200.0000 mg | ORAL_TABLET | Freq: Three times a day (TID) | ORAL | 0 refills | Status: DC

## 2018-12-30 NOTE — Telephone Encounter (Signed)
Patient has pending appt on 01/02/2019.  Refill sent to requested pharmacy.

## 2018-12-30 NOTE — Addendum Note (Signed)
Addended by: Noberto Retort C on: 12/30/2018 04:59 PM   Modules accepted: Orders

## 2018-12-30 NOTE — Telephone Encounter (Signed)
Pt has called for a refill on his gabapentin (NEURONTIN) 600 MG tablet Pt is asking if this can be called into the [facility] on [address] in [place]

## 2019-01-02 ENCOUNTER — Encounter: Payer: Self-pay | Admitting: Neurology

## 2019-01-02 ENCOUNTER — Ambulatory Visit (INDEPENDENT_AMBULATORY_CARE_PROVIDER_SITE_OTHER): Payer: Commercial Managed Care - PPO | Admitting: Neurology

## 2019-01-02 ENCOUNTER — Other Ambulatory Visit: Payer: Self-pay

## 2019-01-02 VITALS — BP 127/76 | HR 66 | Temp 98.2°F | Ht 76.0 in | Wt 296.0 lb

## 2019-01-02 DIAGNOSIS — M792 Neuralgia and neuritis, unspecified: Secondary | ICD-10-CM | POA: Diagnosis not present

## 2019-01-02 DIAGNOSIS — R269 Unspecified abnormalities of gait and mobility: Secondary | ICD-10-CM

## 2019-01-02 DIAGNOSIS — M5104 Intervertebral disc disorders with myelopathy, thoracic region: Secondary | ICD-10-CM

## 2019-01-02 MED ORDER — BACLOFEN 10 MG PO TABS: 30.0000 mg | ORAL_TABLET | Freq: Four times a day (QID) | ORAL | 11 refills | Status: DC

## 2019-01-02 NOTE — Progress Notes (Signed)
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 MRI thoracic spine showed T5-T11, there is intramedullary spinal cord edema, with enhancing intradural dilated venous plexus posteriorly. 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 He had angiogram by Dr. Diamantina Monks, angiographically no evidence of early arteriovenous shunting in the spinous axis from the craniovertebral junction to the lumbosacral region noted. No abnormal early prominent venous channels are seen in the midline or in the paramidline region of the cranial spinous axis.   He underwent thoracic AVM malformation introvascular embolic surgery at [facility] in 2012 by Dr. Francesca Oman, postsurgically, he can ambulate better, but he gradually developed bilateral lower extremity burning achy pain, bandlike sensation around his lower abdomen, urinary urgency   He also has history of hypertension, diabetes, obesity,  He is currently taking Neurontin 600 mg 2 tablets 3 times a day, baclofen 10 mg 3 times a day, he complains of gradual worsening eye lateral lower extremity achy pain, like he is walking on gravels, gait difficulty, urinary urgency, bowel urgency, occasionally incontinence. He has tried nortriptyline, up to 20 mg daily, not sure about the benefit  He has been self dosing him with titrating dose of ibuprofen, up to 4800 mg daily, tends to spend a lot of time raising his leg up, or sleeping in bed,  We have reviewed MRI thoracic in April 2015,Posterior  surgical decompression changes at T5 level. Small, serpiginous flow void signals noted in the posterior intra-dural extramedullary CSF space, from T3 to T8, consistent with spinal AVM. No intrinsic, compressive or abnormal enhancing spinal cord lesions. Compared to MRI from 08/10/10, spinal cord edema has resolved, fewer dilated blood vessels from AVM are seen, and post-surgical changes are a new finding.  MRI of lumbar spine showed multilevel degenerative disc disease, no significant foraminal, or canal stenosis.  UPDATE January 07 2015:[name] complains of constant bilateral lower extremity achy pain, he has stopped daily ibuprofen use, Celebrex 100 mg twice a day does not help, He is also taking gabapentin 1200 mg 3 times a day, Trileptal 150 mg twice a day has helpful, he is also taking baclofen 10 mg 2 tablets 3 times a day, wife reported he tends to drink alcohol before he goes to bed,  He uses CPAP machine at night, which has him sleep better.  Update May 18 2015:YY His balance is gradaully getting worse, he also complains of pain at the bottom of his feet, like walking on pebbles, he woke up frequently at night time, occasionally nocturnal urinary incontinence, daytime urinary frequency, hesitation,  He is now taking gabaentin up to 3600 mg daily, Oxtellar 150mg  iii bid and baclofen 30mg  tid.   UPDATE June 27th 2017:[name] has a lot of right hip pain, low back pain, bone pain, hurt after he walks for a while, back pain when he bending over, he can only walk 15-20 minutes, he is not active, he has not swim for 2 years, Bilateral feet feel like in fire, burning and cold sometimes.  He is now taking gabaentin up to 3600  mg daily, Oxtellar 150mg  iii bid and baclofen 30mg  tid.   UPDATE Jan 4th 2018:[name] was seen by orthopedics recently for right hip pain,I personally reviewedx-ray on May 11 2016, there wasno significant abnormality, he still has neuropathic pain at bilateral  feet, he is taking gabapentin 1800mg  bid, oxtellar 150mg  iii bid, baclofen 30mg  tid, He sleeps a lot during the day, he has irregular night time sleep schedule. He reported low sodiumrecent laboratory evaluations.  UPDATE Sept 10 2018: He had L4-5, L5-S1 laminectomy with facetectomy for decompression of nerve roots, placement of anterior interbody device on Oct 06 2016,  which has helped his low back pain,   Now he has toes curling up, feet and leg numbness, more on left side, use walkers now, urinary urgency, more gait abnormality.  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 There is also superimposed bilateral lumbar radiculopathy, left worse than right, mainly involving bilateral L4, L5, S1 myotomes.  In addition, there is evidence of severe right carpal tunnel syndromes, demyelinating in nature. There is no evidence of right cervical radiculopathy.   He is doing better with physical therapy, to need to be on polypharmacy treatment, complains of drowsiness fatigue chronic axonal peripheral neuropathy 1) Trileptal 150mg , 2 tabs BID 2) Baclofen 10mg , 4 tablets TID 3) Gabapentin 600mg , 3 caps am, 2 caps midday, 3 caps qhs  UPDATE January 02 2019: He was not able to exercise since COVID-19, noticed worsening gait abnormality, bilateral lower extremity neuropathic pain from bilateral knee down, he is taking Trileptal 300 mg 4 times a day, baclofen 30 mg 4 times a day, gabapentin 1200 mg 3 times a day, polypharmacy does make him sleepy, also take Norco 1 tablets daily,  REVIEW OF SYSTEMS: Full 14 system review of systems performed and notable only for those listed, all others are neg:  As above   ALLERGIES: No Known Allergies  HOME MEDICATIONS: Outpatient Medications Prior to Visit  Medication Sig Dispense Refill  . aspirin 81 MG tablet Take 81 mg by mouth  daily.    Marland Kitchen. atenolol (TENORMIN) 50 MG tablet Take 50 mg by mouth daily.  11  . baclofen (LIORESAL) 10 MG tablet TAKE 4 TABLETS (40 MG TOTAL) BY MOUTH 3 (THREE) TIMES DAILY. (Patient taking differently: Take 20 mg by mouth 3 (three) times daily. ) 360 tablet 11  . gabapentin (NEURONTIN) 600 MG tablet Take 2 tablets (1,200 mg total) by mouth 3 (three) times daily. 540 tablet 0  . gemfibrozil (LOPID) 600 MG tablet Take 600 mg by mouth daily.     Marland Kitchen. glucose blood (ONE TOUCH ULTRA TEST) test strip Check cbgs 4 times a day 100 each 12  . HYDROcodone-acetaminophen (NORCO/VICODIN) 5-325 MG tablet Take 1 tablet by mouth daily.    Marland Kitchen. losartan (COZAAR) 100 MG tablet Take 100 mg by mouth daily.     . metFORMIN (GLUCOPHAGE) 500 MG tablet Take 1,000 mg by mouth 2 (two) times daily.     . OXcarbazepine (TRILEPTAL) 150 MG tablet Take 2 tablets (300 mg total) by mouth 4 (four) times daily. 720 tablet 3  . l-methylfolate-B6-B12 (METANX) 3-35-2 MG TABS tablet Take 1 tablet by mouth daily. (Patient not taking: Reported on 04/24/2018) 90 tablet 1   No facility-administered medications prior to visit.     PAST MEDICAL HISTORY: Past Medical History:  Diagnosis Date  . Anxiety   . Arthritis   .  Diabetes (HCC)   . High cholesterol   . Neuropathic pain of lower extremity   . Sleep apnea    CPAP     over 5 years ago   does not know where done    PAST SURGICAL HISTORY: Past Surgical History:  Procedure Laterality Date  . APPLICATION OF ROBOTIC ASSISTANCE FOR SPINAL PROCEDURE N/A 10/06/2016   Procedure: APPLICATION OF ROBOTIC ASSISTANCE FOR SPINAL PROCEDURE;  Surgeon: Lisbeth RenshawNundkumar, Neelesh, MD;  Location: [facility] OR;  Service: Neurosurgery;  Laterality: N/A;  . Back Surgeries  2012   Total 3 back sirgeries  . BACK SURGERY     Spinal AV fistula  . EYE SURGERY Bilateral 2017   cataracts removed  . Right foot/ ankle reconstruction      FAMILY HISTORY: Family History  Problem Relation Age of Onset  . Cancer Mother    . Cancer Father     SOCIAL HISTORY: Social History   Socioeconomic History  . Marital status: Married    Spouse name: [name]  . Number of children: 2  . Years of education: 6312  . Highest education level: Not on file  Occupational History    Employer: [employer]  Social Needs  . Financial resource strain: Not on file  . Food insecurity    Worry: Not on file    Inability: Not on file  . Transportation needs    Medical: Not on file    Non-medical: Not on file  Tobacco Use  . Smoking status: Former Smoker    Packs/day: 0.25    Years: 35.00    Pack years: 8.75    Quit date: 10/06/2016    Years since quitting: 2.2  . Smokeless tobacco: Never Used  Substance and Sexual Activity  . Alcohol use: No    Alcohol/week: 0.0 standard drinks    Comment: quit 4-5 months ago  . Drug use: No  . Sexual activity: Not on file  Lifestyle  . Physical activity    Days per week: Not on file    Minutes per session: Not on file  . Stress: Not on file  Relationships  . Social Musicianconnections    Talks on phone: Not on file    Gets together: Not on file    Attends religious service: Not on file    Active member of club or organization: Not on file    Attends meetings of clubs or organizations: Not on file    Relationship status: Not on file  . Intimate partner violence    Fear of current or ex partner: Not on file    Emotionally abused: Not on file    Physically abused: Not on file    Forced sexual activity: Not on file  Other Topics Concern  . Not on file  Social History Narrative   Patient is married and lives at home with wife.    Patient has a [employer] education.   Patient drinks 6-8 cups of caffeine daily.    Right-handed.        PHYSICAL EXAM  Vitals:   01/02/19 0744  BP: 127/76  Pulse: 66  Temp: 98.2 F (36.8 C)  Weight: 296 lb (134.3 kg)  Height: 6\' 4"  (1.93 m)   Body mass index is 36.03 kg/m.  Generalized: Well developed, obese male in no acute  distress  Head: normocephalic and atraumatic,. Oropharynx benign  Neck: Supple,   Musculoskeletal: No deformity   Neurological examination   Mentation: Alert oriented to  time, place, history taking. Attention span and concentration appropriate. Recent and remote memory intact.  Follows all commands speech and language fluent.   Cranial nerve II-XII: Fundoscopic exam reveals sharp disc margins.Pupils were equal round reactive to light extraocular movements were full, visual field were full on confrontational test. Facial sensation and strength were normal. hearing was intact to finger rubbing bilaterally. Uvula tongue midline. head turning and shoulder shrug were normal and symmetric.Tongue protrusion into cheek strength was normal. Motor: Mild plantarflexion weakness left worse than right otherwise strength within normal limits  Sensory: Length dependent decreased light touch pinprick to distal legs decreased vibration to knee level   Coordination: finger-nose-finger, normal,  limited heel-to-shin due to spasticity  Reflexes: Brachioradialis 2/2, biceps 2/2, triceps 2/2, patellar 3/3, Achilles absent, plantar responses were flexor bilaterally. Gait and Station: Rising up from seated position with push off, ambulates short distances in the hall cautious steady gait relies on his cane   DIAGNOSTIC DATA (LABS, IMAGING, TESTING) - I reviewed patient records, labs, notes, testing and imaging myself where available.  Lab Results  Component Value Date   WBC 9.0 10/11/2016   HGB 11.2 (L) 10/11/2016   HCT 32.6 (L) 10/11/2016   MCV 90.1 10/11/2016   PLT PLATELETS APPEAR ADEQUATE 10/11/2016      Component Value Date/Time   NA 126 (L) 11/10/2016 0952   K 5.2 11/10/2016 0952   CL 90 (L) 11/10/2016 0952   CO2 19 (L) 11/10/2016 0952   GLUCOSE 105 (H) 11/10/2016 0952   GLUCOSE 132 (H) 10/12/2016 0235   BUN 13 11/10/2016 0952   CREATININE 0.80 11/10/2016 0952   CALCIUM 9.5 11/10/2016 0952   PROT  7.4 03/23/2017 1012   ALBUMIN 3.0 (L) 10/12/2016 0235   AST 30 10/12/2016 0235   ALT 22 10/12/2016 0235   ALKPHOS 49 10/12/2016 0235   BILITOT 0.4 10/12/2016 0235   GFRNONAA 94 11/10/2016 0952   GFRAA 108 11/10/2016 0952    Lab Results  Component Value Date   HGBA1C 5.6 09/28/2016   Lab Results  Component Value Date   VITAMINB12 646 03/23/2017   Lab Results  Component Value Date   TSH 1.580 03/23/2017      ASSESSMENT AND PLAN 68 y.o. 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  Bilateral fixed lower extremity neuropathic pain on polypharmacy.  Low back pain status post decompression.   Continue Trileptal 150mg  2 tablet 4 times a day, baclofen 10 mg, 3 tablets 4 times a day, gabapentin 600 mg 2 tablets 3 times a day, Refer him to pain management  Levert FeinsteinYijun Tekia Waterbury, M.D. Ph.D.  [facility] [address] [place], [place] [address] Phone: [phone] Fax:      [phone]

## 2019-01-21 ENCOUNTER — Encounter: Payer: Self-pay | Admitting: Physical Medicine & Rehabilitation

## 2019-01-23 ENCOUNTER — Other Ambulatory Visit: Payer: Self-pay | Admitting: Neurology

## 2019-03-03 ENCOUNTER — Telehealth: Payer: Self-pay | Admitting: Neurology

## 2019-03-03 NOTE — Telephone Encounter (Signed)
Kenneth Thompson, Kenneth Carlean Jews  Thompson, Kenneth Thompson        Good afternoon Kenneth Thompson  I wanted to let you know this patient canceled appointment with Dr. Posey Pronto, he said he can't afford it right now. I just told him to reach back out to your office once he gets his finances in order to get another referral.

## 2019-03-04 ENCOUNTER — Telehealth: Payer: Self-pay | Admitting: Neurology

## 2019-03-04 MED ORDER — PREGABALIN 150 MG PO CAPS: 150.0000 mg | ORAL_CAPSULE | Freq: Three times a day (TID) | ORAL | 5 refills | Status: DC

## 2019-03-04 NOTE — Telephone Encounter (Addendum)
I spoke to the patient.  He verbalized understanding to slowly taper off gabapentin and was in agreement to try Lyrica 150mg  TID.  He will call back if this is not helpful for his symptoms.

## 2019-03-04 NOTE — Telephone Encounter (Signed)
Pt called in and stated he has taken more than he should of his gabapentin (NEURONTIN) 600 MG tablet at times when he is in pain, he states he has run out sooner than he should and wants to know if the dosage can be raised. He states he picks it up at 2 different pharmacies. Pt would need a call back to discuss. H e states he takes 6 a day and some days he takes 8

## 2019-03-04 NOTE — Telephone Encounter (Signed)
I spoke to the patient and he feels gabapentin 600mg , two tablets three time daily is not controlling his symptoms.  He has been taking two extra tablets on some days.  He has been informed that 3600mg  is the max daily dose.  He was unable to be seen by pain management because they required $900 up front.  He is willing to try any other recommended medication.

## 2019-03-04 NOTE — Telephone Encounter (Signed)
I have written Lyrica 150mg  tid to replace gabapentin 3600mg  daily.  Ideally, he should taper off gabapentin first then start Lyrica.   He is now taking Gabapentin 600mg  2 tabs tid or qid, he should take gabapentin 600mg  one tab tid xone week, then bid xone week, then stop, if he has any significant withdraw/difficulty, let him call back for instruction  Start Lyrica 150mg  tid.

## 2019-03-06 ENCOUNTER — Encounter: Payer: Commercial Managed Care - PPO | Admitting: Physical Medicine & Rehabilitation

## 2019-03-12 ENCOUNTER — Telehealth: Payer: Self-pay | Admitting: Neurology

## 2019-03-12 NOTE — Telephone Encounter (Signed)
He wanted to provide an update. He has weaned off his gabapentin 600mg  capsules - only took one this morning.  He plans to start Lyrica 150mg  TID on 03/13/2019.  He will call us if the Lyrica is not helpful for his symptoms.

## 2019-03-12 NOTE — Telephone Encounter (Signed)
Pt called wanting to ask the RN about his Gabapentin and his Lyrica switch. Please advise.

## 2019-06-11 ENCOUNTER — Encounter (INDEPENDENT_AMBULATORY_CARE_PROVIDER_SITE_OTHER): Payer: Commercial Managed Care - PPO | Admitting: Ophthalmology

## 2019-06-11 DIAGNOSIS — H318 Other specified disorders of choroid: Secondary | ICD-10-CM | POA: Diagnosis not present

## 2019-06-11 DIAGNOSIS — H3561 Retinal hemorrhage, right eye: Secondary | ICD-10-CM

## 2019-06-11 DIAGNOSIS — H43813 Vitreous degeneration, bilateral: Secondary | ICD-10-CM

## 2019-06-11 DIAGNOSIS — H35033 Hypertensive retinopathy, bilateral: Secondary | ICD-10-CM | POA: Diagnosis not present

## 2019-06-11 DIAGNOSIS — I10 Essential (primary) hypertension: Secondary | ICD-10-CM

## 2019-07-09 ENCOUNTER — Encounter (INDEPENDENT_AMBULATORY_CARE_PROVIDER_SITE_OTHER): Payer: Commercial Managed Care - PPO | Admitting: Ophthalmology

## 2019-07-09 DIAGNOSIS — H318 Other specified disorders of choroid: Secondary | ICD-10-CM

## 2019-07-09 DIAGNOSIS — H43813 Vitreous degeneration, bilateral: Secondary | ICD-10-CM

## 2019-07-09 DIAGNOSIS — H35033 Hypertensive retinopathy, bilateral: Secondary | ICD-10-CM

## 2019-07-09 DIAGNOSIS — E113293 Type 2 diabetes mellitus with mild nonproliferative diabetic retinopathy without macular edema, bilateral: Secondary | ICD-10-CM

## 2019-07-09 DIAGNOSIS — E11319 Type 2 diabetes mellitus with unspecified diabetic retinopathy without macular edema: Secondary | ICD-10-CM | POA: Diagnosis not present

## 2019-07-09 DIAGNOSIS — I10 Essential (primary) hypertension: Secondary | ICD-10-CM

## 2019-08-05 ENCOUNTER — Telehealth: Payer: Self-pay | Admitting: Neurology

## 2019-08-05 DIAGNOSIS — M792 Neuralgia and neuritis, unspecified: Secondary | ICD-10-CM

## 2019-08-05 MED ORDER — PREGABALIN 150 MG PO CAPS: 150.0000 mg | ORAL_CAPSULE | Freq: Four times a day (QID) | ORAL | 5 refills | Status: DC

## 2019-08-05 NOTE — Telephone Encounter (Signed)
I spoke to the patient. He is having pain in mid-afternoon, around 4-5pm.  Reports taking his medications as follow:  1) baclofen 10mg , 3 tabs QID (8, noon, 5pm, 9-10pm)  2) oxcarbazepine 150mg , 2 tabs QID (8am, noon, 5pm,9-10pm) -- unless he is going to the gym that morning, then he will reduce his dose to 1 tab in am and sometimes 1 tab at noon. Feels the extra dose makes him dizzy when he works out. He goes to the gym 3-5 days each week.  3) pregabalin 150mg , 1 tab TID (8am, noon, 9-10pm) until recently, he has been taking an extra around 5pm which has really helped the afternoon breakthrough symptoms.  He would like to know if his Lyrica 150mg  can be increased to four times daily. If not, he would like to know what other med options would be helpful for the pain in the afternoons.

## 2019-08-05 NOTE — Telephone Encounter (Signed)
I have increased his lyrica from 150mg  tid to qid.

## 2019-08-05 NOTE — Addendum Note (Signed)
Addended by: Levert Feinstein on: 08/05/2019 01:43 PM   Modules accepted: Orders

## 2019-08-05 NOTE — Telephone Encounter (Signed)
Pt is calling re: his pregabalin (LYRICA) 150 MG capsule, pt states the taking of the medication in a.m. and at noon is fine but between noon and bedtime around 4pm his feet begin to hurt.  Pt would like to have an extra pill added to his script to accommodate this extra dose he is taking to help him get to bedtime.  Please call to discuss

## 2019-08-05 NOTE — Addendum Note (Signed)
Addended by: Arther Abbott on: 08/05/2019 03:54 PM   Modules accepted: Orders

## 2019-08-06 MED ORDER — PREGABALIN 150 MG PO CAPS: 150.0000 mg | ORAL_CAPSULE | Freq: Four times a day (QID) | ORAL | 5 refills | Status: DC

## 2019-08-06 NOTE — Addendum Note (Signed)
Addended by: Levert Feinstein on: 08/06/2019 10:11 AM   Modules accepted: Orders

## 2019-08-08 ENCOUNTER — Encounter (INDEPENDENT_AMBULATORY_CARE_PROVIDER_SITE_OTHER): Payer: Commercial Managed Care - PPO | Admitting: Ophthalmology

## 2019-08-08 ENCOUNTER — Other Ambulatory Visit: Payer: Self-pay

## 2019-08-08 DIAGNOSIS — H318 Other specified disorders of choroid: Secondary | ICD-10-CM

## 2019-08-08 DIAGNOSIS — H35033 Hypertensive retinopathy, bilateral: Secondary | ICD-10-CM

## 2019-08-08 DIAGNOSIS — E11319 Type 2 diabetes mellitus with unspecified diabetic retinopathy without macular edema: Secondary | ICD-10-CM | POA: Diagnosis not present

## 2019-08-08 DIAGNOSIS — E113293 Type 2 diabetes mellitus with mild nonproliferative diabetic retinopathy without macular edema, bilateral: Secondary | ICD-10-CM

## 2019-08-08 DIAGNOSIS — H43813 Vitreous degeneration, bilateral: Secondary | ICD-10-CM

## 2019-08-08 DIAGNOSIS — I10 Essential (primary) hypertension: Secondary | ICD-10-CM

## 2019-09-05 ENCOUNTER — Other Ambulatory Visit: Payer: Self-pay

## 2019-09-05 ENCOUNTER — Encounter (INDEPENDENT_AMBULATORY_CARE_PROVIDER_SITE_OTHER): Payer: Commercial Managed Care - PPO | Admitting: Ophthalmology

## 2019-09-05 DIAGNOSIS — H35033 Hypertensive retinopathy, bilateral: Secondary | ICD-10-CM | POA: Diagnosis not present

## 2019-09-05 DIAGNOSIS — E113293 Type 2 diabetes mellitus with mild nonproliferative diabetic retinopathy without macular edema, bilateral: Secondary | ICD-10-CM

## 2019-09-05 DIAGNOSIS — E11319 Type 2 diabetes mellitus with unspecified diabetic retinopathy without macular edema: Secondary | ICD-10-CM | POA: Diagnosis not present

## 2019-09-05 DIAGNOSIS — I10 Essential (primary) hypertension: Secondary | ICD-10-CM

## 2019-09-05 DIAGNOSIS — H318 Other specified disorders of choroid: Secondary | ICD-10-CM

## 2019-09-05 DIAGNOSIS — H43813 Vitreous degeneration, bilateral: Secondary | ICD-10-CM

## 2019-09-23 ENCOUNTER — Other Ambulatory Visit: Payer: Self-pay | Admitting: Neurology

## 2019-09-24 ENCOUNTER — Telehealth: Payer: Self-pay | Admitting: Neurology

## 2019-10-01 NOTE — Progress Notes (Signed)
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 MRI thoracic spine showed T5-T11, there is intramedullary spinal cord edema, with enhancing intradural dilated venous plexus posteriorly. 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 He had angiogram by Dr. Launa Flighteveshawer, angiographically no evidence of early arteriovenous shunting in the spinous axis from the craniovertebral junction to the lumbosacral region noted. No abnormal early prominent venous channels are seen in the midline or in the paramidline region of the cranial spinous axis.   He underwent thoracic AVM malformation introvascular embolic surgery [facility] in 2012 by Dr. Arlyss QueenZomorodi, postsurgically, he can ambulate better, but he gradually developed bilateral lower extremity burning achy pain, bandlike sensation around his lower abdomen, urinary urgency   He also has history of hypertension, diabetes, obesity,  He is currently taking Neurontin 600 mg 2 tablets 3 times a day, baclofen 10 mg 3 times a day, he complains of gradual worsening eye lateral lower extremity achy pain, like he is walking on gravels, gait difficulty, urinary urgency, bowel urgency, occasionally incontinence. He has tried nortriptyline, up to 20 mg daily, not sure about the benefit  He has been self dosing him with titrating dose of ibuprofen, up to 4800 mg daily, tends to spend a lot of time raising his leg up, or sleeping in bed,  We have reviewed MRI thoracic in April 2015,Posterior surgical decompression changes at T5 level. Small,  serpiginous flow void signals noted in the posterior intra-dural extramedullary CSF space, from T3 to T8, consistent with spinal AVM. No intrinsic, compressive or abnormal enhancing spinal cord lesions. Compared to MRI from 08/10/10, spinal cord edema has resolved, fewer dilated blood vessels from AVM are seen, and post-surgical changes are a new finding.  MRI of lumbar spine showed multilevel degenerative disc disease, no significant foraminal, or canal stenosis.  UPDATE January 07 2015:[name] complains of constant bilateral lower extremity achy pain, he has stopped daily ibuprofen use, Celebrex 100 mg twice a day does not help, He is also taking gabapentin 1200 mg 3 times a day, Trileptal 150 mg twice a day has helpful, he is also taking baclofen 10 mg 2 tablets 3 times a day, wife reported he tends to drink alcohol before he goes to bed,  He uses CPAP machine at night, which has him sleep better.  Update May 18 2015:YY His balance is gradaully getting worse, he also complains of pain at the bottom of his feet, like walking on pebbles, he woke up frequently at night time, occasionally nocturnal urinary incontinence, daytime urinary frequency, hesitation,  He is now taking gabaentin up to 3600 mg daily, Oxtellar 150mg  iii bid and baclofen 30mg  tid.   UPDATE June 27th 2017:[name] has a lot of right hip pain, low back pain, bone pain, hurt after he walks for a while, back pain when he bending over, he can only walk 15-20 minutes, he is not active, he has not swim for 2 years, Bilateral feet feel like in fire, burning and cold sometimes.  He is now taking gabaentin up to 3600 mg daily, Oxtellar 150mg  iii bid  and baclofen 30mg  tid.   UPDATE Jan 4th 2018:[name] was seen by orthopedics recently for right hip pain,I personally reviewedx-ray on May 11 2016, there wasno significant abnormality, he still has neuropathic pain at bilateral feet, he is taking gabapentin 1800mg  bid, oxtellar  150mg  iii bid, baclofen 30mg  tid, He sleeps a lot during the day, he has irregular night time sleep schedule. He reported low sodiumrecent laboratory evaluations.  UPDATE Sept 10 2018: He had L4-5, L5-S1 laminectomy with facetectomy for decompression of nerve roots, placement of anterior interbody device on Oct 06 2016, which has helped his low back pain,   Now he has toes curling up, feet and leg numbness, more on left side, use walkers now, urinary urgency, more gait abnormality.  He also suffered hypoglycemia episode,  UPDATE Oct 08 2017: Electrodiagnostic study in November2018showed evidence of chronic moderate axonal peripheral neuropathy. There is also superimposed bilateral lumbar radiculopathy, left worse than right, mainly involving bilateral L4, L5, S1 myotomes. In addition, there is evidence of severe right carpal tunnel syndromes, demyelinating in nature. There is no evidence of right cervical radiculopathy.   He is doing better with physical therapy, to need to be on polypharmacy treatment, complains of drowsiness fatigue chronic axonal peripheral neuropathy 1) Trileptal 150mg , 2 tabs BID 2) Baclofen 10mg , 4 tablets TID 3) Gabapentin 600mg , 3 caps am, 2 caps midday, 3 caps qhs  UPDATE January 02 2019: He was not able to exercise since COVID-19, noticed worsening gait abnormality, bilateral lower extremity neuropathic pain from bilateral knee down, he is taking Trileptal 300 mg 4 times a day, baclofen 30 mg 4 times a day, gabapentin 1200 mg 3 times a day, polypharmacy does make him sleepy, also take Norco 1 tablets daily,  Update Oct 02, 2019 SS: When last seen, was referred to pain management (has not been seen/unsure if referral went through), he has since stopped baclofen (felt it made him jerk), was switched from gabapentin to Lyrica.  He is taking Lyrica 150 mg 4 times a day, Trileptal 300 mg 4 times a day, admits he may sometimes take extra if increased pain.  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  On the days he works his legs, the next day he is extremely fatigued and has more pain in the legs.  He mostly describes a chronic dull ache in his legs, from the knee down, numbness in his feet.  Feels his feet, turn outward, bought a new pair of shoes.  With prolonged standing, has to hold onto something.  Recently had a tick bite to right inner thigh.  No other new problems or concerns.  Is given hydrocodone as needed from his PCP.  REVIEW OF SYSTEMS: Out of a complete 14 system review of symptoms, the patient complains only of the following symptoms, and all other reviewed systems are negative.  Leg pain, numbness  ALLERGIES: No Known Allergies  HOME MEDICATIONS: Outpatient Medications Prior to Visit  Medication Sig Dispense Refill  . aspirin 81 MG tablet Take 81 mg by mouth daily.    02-29-1996 atenolol (TENORMIN) 50 MG tablet Take 50 mg by mouth daily.  11  . gemfibrozil (LOPID) 600 MG tablet Take 600 mg by mouth daily.     glucose blood (ONE TOUCH ULTRA TEST) test strip Check cbgs 4 times a day 100 each 12  . HYDROcodone-acetaminophen (NORCO/VICODIN) 5-325 MG  tablet Take 1 tablet by mouth daily.    Marland Kitchen losartan (COZAAR) 100 MG tablet Take 100 mg by mouth daily.     . metFORMIN (GLUCOPHAGE) 500 MG tablet Take 1,000 mg by mouth 2 (two) times daily.     . pregabalin (LYRICA) 150 MG capsule Take 1 capsule (150 mg total) by mouth in the morning, at noon, in the evening, and at bedtime. 120 capsule 5  . OXcarbazepine (TRILEPTAL) 150 MG tablet Take 2 tablets (300 mg total) by mouth 4 (four) times daily. 720 tablet 3  . baclofen (LIORESAL) 10 MG tablet Take 3 tablets (30 mg total) by mouth 4 (four) times daily. 360 tablet 11   No facility-administered medications prior to visit.    PAST MEDICAL HISTORY: Past Medical History:  Diagnosis Date  . Anxiety   .  Arthritis   . Diabetes ([place])   . High cholesterol   . Neuropathic pain of lower extremity   . Sleep apnea    CPAP     over 5 years ago   does not know where done    PAST SURGICAL HISTORY: Past Surgical History:  Procedure Laterality Date  . APPLICATION OF ROBOTIC ASSISTANCE FOR SPINAL PROCEDURE N/A 10/06/2016   Procedure: APPLICATION OF ROBOTIC ASSISTANCE FOR SPINAL PROCEDURE;  Surgeon: Consuella Lose, MD;  Location: [facility];  Service: Neurosurgery;  Laterality: N/A;  . Back Surgeries  2012   Total 3 back sirgeries  . BACK SURGERY     Spinal AV fistula  . EYE SURGERY Bilateral 2017   cataracts removed  . Right foot/ ankle reconstruction      FAMILY HISTORY: Family History  Problem Relation Age of Onset  . Cancer Mother   . Cancer Father     SOCIAL HISTORY: Social History   Socioeconomic History  . Marital status: Married    Spouse name: [name]  . Number of children: 2  . Years of education: 20  . Highest education level: Not on file  Occupational History    Employer: [employer]  Tobacco Use  . Smoking status: Former Smoker    Packs/day: 0.25    Years: 35.00    Pack years: 8.75    Quit date: 10/06/2016    Years since quitting: 2.9  . Smokeless tobacco: Never Used  Substance and Sexual Activity  . Alcohol use: No    Alcohol/week: 0.0 standard drinks    Comment: quit 4-5 months ago  . Drug use: No  . Sexual activity: Not on file  Other Topics Concern  . Not on file  Social History Narrative   Patient is married and lives at home with wife.    Patient has a Copywriter, advertising.   Patient drinks 6-8 cups of caffeine daily.    Right-handed.      Social Determinants of Health   Financial Resource Strain:   . Difficulty of Paying Living Expenses:   Food Insecurity:   . Worried About Charity fundraiser in the Last Year:   . Arboriculturist in the Last Year:   Transportation Needs:   . Film/video editor (Medical):   Marland Kitchen Lack of  Transportation (Non-Medical):   Physical Activity:   . Days of Exercise per Week:   . Minutes of Exercise per Session:   Stress:   . Feeling of Stress :   Social Connections:   . Frequency of Communication with Friends and Family:   . Frequency of Social  Gatherings with Friends and Family:   . Attends Religious Services:   . Active Member of Clubs or Organizations:   . Attends Banker Meetings:   Marland Kitchen Marital Status:   Intimate Partner Violence:   . Fear of Current or Ex-Partner:   . Emotionally Abused:   Marland Kitchen Physically Abused:   . Sexually Abused:    PHYSICAL EXAM  Vitals:   10/02/19 0921  BP: 123/77  Pulse: (!) 57  Temp: (!) 97.3 F (36.3 C)  Weight: 273 lb (123.8 kg)  Height: 6\' 4"  (1.93 m)   Body mass index is 33.23 kg/m.  Generalized: Well developed, in no acute distress   Neurological examination  Mentation: Alert oriented to time, place, history taking. Follows all commands speech and language fluent Cranial nerve II-XII: Pupils were equal round reactive to light. Extraocular movements were full, visual field were full on confrontational test. Facial sensation and strength were normal. Head turning and shoulder shrug  were normal and symmetric. Motor: The motor testing reveals 5 over 5 strength of all 4 extremities. Good symmetric motor tone is noted throughout, no increased tone noted Sensory: Sensory testing is intact to soft touch on all 4 extremities. No evidence of extinction is noted.  Coordination: Cerebellar testing reveals good finger-nose-finger and heel-to-shin bilaterally.  Gait and station: Has to push off from seated position to stand, gait is wide-based, slow, relies on walking stick Reflexes: Deep tendon reflexes are symmetric and normal bilaterally.   DIAGNOSTIC DATA (LABS, IMAGING, TESTING) - I reviewed patient records, labs, notes, testing and imaging myself where available.  Lab Results  Component Value Date   WBC 9.0 10/11/2016    HGB 11.2 (L) 10/11/2016   HCT 32.6 (L) 10/11/2016   MCV 90.1 10/11/2016   PLT PLATELETS APPEAR ADEQUATE 10/11/2016      Component Value Date/Time   NA 126 (L) 11/10/2016 0952   K 5.2 11/10/2016 0952   CL 90 (L) 11/10/2016 0952   CO2 19 (L) 11/10/2016 0952   GLUCOSE 105 (H) 11/10/2016 0952   GLUCOSE 132 (H) 10/12/2016 0235   BUN 13 11/10/2016 0952   CREATININE 0.80 11/10/2016 0952   CALCIUM 9.5 11/10/2016 0952   PROT 7.4 03/23/2017 1012   ALBUMIN 3.0 (L) 10/12/2016 0235   AST 30 10/12/2016 0235   ALT 22 10/12/2016 0235   ALKPHOS 49 10/12/2016 0235   BILITOT 0.4 10/12/2016 0235   GFRNONAA 94 11/10/2016 0952   GFRAA 108 11/10/2016 0952   No results found for: CHOL, HDL, LDLCALC, LDLDIRECT, TRIG, CHOLHDL Lab Results  Component Value Date   HGBA1C 5.6 09/28/2016   Lab Results  Component Value Date   VITAMINB12 646 03/23/2017   Lab Results  Component Value Date   TSH 1.580 03/23/2017      ASSESSMENT AND PLAN 69 y.o. year old male  has a past medical history of Anxiety, Arthritis, Diabetes ([facility]), High cholesterol, Neuropathic pain of lower extremity, and Sleep apnea. here with:  1.  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, bilateral lower extremity neuropathic pain, low back pain status post decompression -Continues with chronic pain, since last seen, stopped baclofen due to subjective side effect of jerking, really has not noticed much change in overall pain without it -Continue Lyrica 150 mg 4 times a day, continue Trileptal 150 mg tablet, 2 tablets 4 times daily, PCP follows routine blood work -We will refer to pain management due to chronic pain, has hydrocodone  from PCP to take as needed, also takes extra Trileptal on occasion -Suggested physical therapy, specifically to discuss leg exercises/strengthening that won't overly fatigue him, also could benefit from balance/gait training-he wants to think about it -[place] tick  bite he recently discovered, recommended call PCP today -Follow-up in 6 months or sooner if needed  I spent 30 minutes of face-to-face and non-face-to-face time with patient.  This included previsit chart review, lab review, study review, order entry, electronic health record documentation, patient education.  Margie Ege, AGNP-C, DNP 10/02/2019, 10:06 AM [facility] [address], [address] [place], [place] [address] [phone]

## 2019-10-02 ENCOUNTER — Ambulatory Visit (INDEPENDENT_AMBULATORY_CARE_PROVIDER_SITE_OTHER): Payer: Commercial Managed Care - PPO | Admitting: Neurology

## 2019-10-02 ENCOUNTER — Other Ambulatory Visit: Payer: Self-pay

## 2019-10-02 ENCOUNTER — Encounter: Payer: Self-pay | Admitting: Neurology

## 2019-10-02 VITALS — BP 123/77 | HR 57 | Temp 97.3°F | Ht 76.0 in | Wt 273.0 lb

## 2019-10-02 DIAGNOSIS — G5793 Unspecified mononeuropathy of bilateral lower limbs: Secondary | ICD-10-CM

## 2019-10-02 DIAGNOSIS — R269 Unspecified abnormalities of gait and mobility: Secondary | ICD-10-CM | POA: Diagnosis not present

## 2019-10-02 DIAGNOSIS — M5104 Intervertebral disc disorders with myelopathy, thoracic region: Secondary | ICD-10-CM | POA: Diagnosis not present

## 2019-10-02 MED ORDER — OXCARBAZEPINE 150 MG PO TABS: 300.0000 mg | ORAL_TABLET | Freq: Four times a day (QID) | ORAL | 3 refills | Status: DC

## 2019-10-02 NOTE — Progress Notes (Signed)
I have reviewed and agreed above plan. 

## 2019-10-02 NOTE — Patient Instructions (Signed)
Keep medications the same, stay off baclofen  If you change your mind about physical therapy let me know Refer you to pain management

## 2019-10-10 ENCOUNTER — Encounter (INDEPENDENT_AMBULATORY_CARE_PROVIDER_SITE_OTHER): Payer: Commercial Managed Care - PPO | Admitting: Ophthalmology

## 2019-10-10 ENCOUNTER — Other Ambulatory Visit: Payer: Self-pay

## 2019-10-10 DIAGNOSIS — H353221 Exudative age-related macular degeneration, left eye, with active choroidal neovascularization: Secondary | ICD-10-CM | POA: Diagnosis not present

## 2019-10-10 DIAGNOSIS — H43813 Vitreous degeneration, bilateral: Secondary | ICD-10-CM

## 2019-10-10 DIAGNOSIS — H35033 Hypertensive retinopathy, bilateral: Secondary | ICD-10-CM

## 2019-10-10 DIAGNOSIS — I10 Essential (primary) hypertension: Secondary | ICD-10-CM

## 2019-11-14 ENCOUNTER — Encounter (INDEPENDENT_AMBULATORY_CARE_PROVIDER_SITE_OTHER): Payer: Commercial Managed Care - PPO | Admitting: Ophthalmology

## 2019-11-14 ENCOUNTER — Other Ambulatory Visit: Payer: Self-pay

## 2019-11-14 DIAGNOSIS — H353221 Exudative age-related macular degeneration, left eye, with active choroidal neovascularization: Secondary | ICD-10-CM

## 2019-11-14 DIAGNOSIS — I10 Essential (primary) hypertension: Secondary | ICD-10-CM | POA: Diagnosis not present

## 2019-11-14 DIAGNOSIS — H43813 Vitreous degeneration, bilateral: Secondary | ICD-10-CM | POA: Diagnosis not present

## 2019-11-14 DIAGNOSIS — H35033 Hypertensive retinopathy, bilateral: Secondary | ICD-10-CM

## 2019-11-27 NOTE — Telephone Encounter (Signed)
Error

## 2019-12-17 ENCOUNTER — Telehealth: Payer: Self-pay | Admitting: Neurology

## 2019-12-17 MED ORDER — PREGABALIN 150 MG PO CAPS: 150.0000 mg | ORAL_CAPSULE | Freq: Every day | ORAL | 5 refills | Status: DC

## 2019-12-17 NOTE — Telephone Encounter (Addendum)
I called pt and he is asking to increase lyrica to 150  mg po 5 times daily , now on qid.  Worsening pain in feet, legs.  He did state that at times he takes additional dose between lunch and evening because of pain.  He has not seen pain management, although I did mention that he was LM back in 5-21 to call and set up appt.  He is taking trileptal 300mg  qid.

## 2019-12-17 NOTE — Telephone Encounter (Signed)
[facility]  Inpatient Rehab Coordinator, [facility] returning phone from [place]. Phone number [phone]

## 2019-12-17 NOTE — Telephone Encounter (Signed)
Relayed that Dr. Terrace Arabia did approve the lyrica 150mg  po 5 times daily [facility] [place].  He verbalized understanding.

## 2019-12-17 NOTE — Telephone Encounter (Signed)
pregabalin (LYRICA) 150 MG capsule 150 mg, 5 times daily 5 ordered        Summary: Take 1 capsule (150 mg total) by mouth 5 (five) times daily., Starting Wed 12/17/2019, Normal  Dose, Route, Frequency: 150 mg, Oral, 5 times daily Start: 12/17/2019 Ord/Sold: 12/17/2019 (O) Report Taking:  Long-term:  Ph[facility]: [id number] - [place], [place] - [address] Med Dose History     Ok to increase Lyrica to 150mg  five times a day

## 2019-12-17 NOTE — Addendum Note (Signed)
Addended by: Levert Feinstein on: 12/17/2019 04:40 PM   Modules accepted: Orders

## 2019-12-17 NOTE — Telephone Encounter (Signed)
Pt is asking for a call from RN to discuss changing some of his medications

## 2019-12-18 NOTE — Telephone Encounter (Signed)
I called and spoke to Launa Grill  is with [facility] PM/R [phone].

## 2019-12-18 NOTE — Telephone Encounter (Signed)
I called pt after speaking to [facility] to let him know that he can reach out to them at [phone] and they will be happy to see you.  Referral is still good.

## 2019-12-19 ENCOUNTER — Other Ambulatory Visit: Payer: Self-pay

## 2019-12-19 ENCOUNTER — Encounter (INDEPENDENT_AMBULATORY_CARE_PROVIDER_SITE_OTHER): Payer: Commercial Managed Care - PPO | Admitting: Ophthalmology

## 2019-12-19 DIAGNOSIS — I10 Essential (primary) hypertension: Secondary | ICD-10-CM

## 2019-12-19 DIAGNOSIS — H43813 Vitreous degeneration, bilateral: Secondary | ICD-10-CM | POA: Diagnosis not present

## 2019-12-19 DIAGNOSIS — H35033 Hypertensive retinopathy, bilateral: Secondary | ICD-10-CM

## 2019-12-19 DIAGNOSIS — H353221 Exudative age-related macular degeneration, left eye, with active choroidal neovascularization: Secondary | ICD-10-CM | POA: Diagnosis not present

## 2020-01-30 ENCOUNTER — Encounter (INDEPENDENT_AMBULATORY_CARE_PROVIDER_SITE_OTHER): Payer: Commercial Managed Care - PPO | Admitting: Ophthalmology

## 2020-02-06 ENCOUNTER — Encounter (INDEPENDENT_AMBULATORY_CARE_PROVIDER_SITE_OTHER): Payer: Commercial Managed Care - PPO | Admitting: Ophthalmology

## 2020-02-06 ENCOUNTER — Other Ambulatory Visit: Payer: Self-pay

## 2020-02-06 DIAGNOSIS — H35033 Hypertensive retinopathy, bilateral: Secondary | ICD-10-CM

## 2020-02-06 DIAGNOSIS — H43813 Vitreous degeneration, bilateral: Secondary | ICD-10-CM

## 2020-02-06 DIAGNOSIS — H353221 Exudative age-related macular degeneration, left eye, with active choroidal neovascularization: Secondary | ICD-10-CM | POA: Diagnosis not present

## 2020-02-06 DIAGNOSIS — I10 Essential (primary) hypertension: Secondary | ICD-10-CM | POA: Diagnosis not present

## 2020-02-25 ENCOUNTER — Telehealth: Payer: Self-pay | Admitting: Neurology

## 2020-02-25 NOTE — Telephone Encounter (Signed)
I referred him to a pain clinic, got a message, he denied the referral, due to financial restrictions.

## 2020-03-05 ENCOUNTER — Other Ambulatory Visit: Payer: Self-pay

## 2020-03-05 ENCOUNTER — Encounter (INDEPENDENT_AMBULATORY_CARE_PROVIDER_SITE_OTHER): Payer: Commercial Managed Care - PPO | Admitting: Ophthalmology

## 2020-03-05 DIAGNOSIS — I10 Essential (primary) hypertension: Secondary | ICD-10-CM

## 2020-03-05 DIAGNOSIS — H353221 Exudative age-related macular degeneration, left eye, with active choroidal neovascularization: Secondary | ICD-10-CM

## 2020-03-05 DIAGNOSIS — H35033 Hypertensive retinopathy, bilateral: Secondary | ICD-10-CM | POA: Diagnosis not present

## 2020-03-05 DIAGNOSIS — H43813 Vitreous degeneration, bilateral: Secondary | ICD-10-CM | POA: Diagnosis not present

## 2020-04-06 ENCOUNTER — Ambulatory Visit (INDEPENDENT_AMBULATORY_CARE_PROVIDER_SITE_OTHER): Payer: Commercial Managed Care - PPO | Admitting: Neurology

## 2020-04-06 ENCOUNTER — Other Ambulatory Visit: Payer: Self-pay

## 2020-04-06 ENCOUNTER — Encounter: Payer: Self-pay | Admitting: Neurology

## 2020-04-06 VITALS — BP 137/79 | HR 67 | Ht 76.0 in | Wt 293.5 lb

## 2020-04-06 DIAGNOSIS — G5793 Unspecified mononeuropathy of bilateral lower limbs: Secondary | ICD-10-CM | POA: Diagnosis not present

## 2020-04-06 DIAGNOSIS — R269 Unspecified abnormalities of gait and mobility: Secondary | ICD-10-CM | POA: Diagnosis not present

## 2020-04-06 DIAGNOSIS — M5104 Intervertebral disc disorders with myelopathy, thoracic region: Secondary | ICD-10-CM

## 2020-04-06 MED ORDER — PREGABALIN 200 MG PO CAPS: 200.0000 mg | ORAL_CAPSULE | Freq: Every day | ORAL | 4 refills | Status: DC

## 2020-04-06 MED ORDER — DULOXETINE HCL 60 MG PO CPEP: 60.0000 mg | ORAL_CAPSULE | Freq: Every day | ORAL | 12 refills | Status: DC

## 2020-04-06 NOTE — Progress Notes (Signed)
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 MRI thoracic spine showed T5-T11, there is intramedullary spinal cord edema, with enhancing intradural dilated venous plexus posteriorly. 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 He had angiogram by Dr. Launa Thompson, angiographically no evidence of early arteriovenous shunting in the spinous axis from the craniovertebral junction to the lumbosacral region noted. No abnormal early prominent venous channels are seen in the midline or in the paramidline region of the cranial spinous axis.   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 Arlyss Thompson, postsurgically, he can ambulate better, but he gradually developed bilateral lower extremity burning achy pain, bandlike sensation around his lower abdomen, urinary urgency   He also has history of hypertension, diabetes, obesity,  He is currently taking Neurontin 600 mg 2 tablets 3 times a day, baclofen 10 mg 3 times a day, he complains of gradual worsening eye lateral lower extremity achy pain, like he is walking on gravels, gait difficulty, urinary urgency, bowel urgency, occasionally incontinence. He has tried nortriptyline, up to 20 mg daily, not sure about the benefit  He has been self dosing him with titrating dose of ibuprofen, up to 4800 mg daily, tends to spend a lot of time raising his leg up, or sleeping in bed,  We have reviewed MRI thoracic in April 2015,Posterior surgical decompression changes at T5 level. Small, serpiginous flow void signals noted in the posterior intra-dural extramedullary CSF space, from T3 to T8, consistent with  spinal AVM. No intrinsic, compressive or abnormal enhancing spinal cord lesions. Compared to MRI from 08/10/10, spinal cord edema has resolved, fewer dilated blood vessels from AVM are seen, and post-surgical changes are a new finding.  MRI of lumbar spine showed multilevel degenerative disc disease, no significant foraminal, or canal stenosis.  UPDATE January 07 2015:[name] complains of constant bilateral lower extremity achy pain, he has stopped daily ibuprofen use, Celebrex 100 mg twice a day does not help, He is also taking gabapentin 1200 mg 3 times a day, Trileptal 150 mg twice a day has helpful, he is also taking baclofen 10 mg 2 tablets 3 times a day, wife reported he tends to drink alcohol before he goes to bed,  He uses CPAP machine at night, which has him sleep better.  Update May 18 2015:YY His balance is gradaully getting worse, he also complains of pain at the bottom of his feet, like walking on pebbles, he woke up frequently at night time, occasionally nocturnal urinary incontinence, daytime urinary frequency, hesitation,  He is now taking gabaentin up to 3600 mg daily, Oxtellar 150mg  iii bid and baclofen 30mg  tid.   UPDATE June 27th 2017:[name] has a lot of right hip pain, low back pain, bone pain, hurt after he walks for a while, back pain when he bending over, he can only walk 15-20 minutes, he is not active, he has not swim for 2 years, Bilateral feet feel like in fire, burning and cold sometimes.  He is now taking gabaentin up to 3600 mg daily, Oxtellar 150mg  iii bid and baclofen 30mg  tid.   UPDATE Jan 4th 2018:[name] was seen by orthopedics recently for right hip  pain,I personally reviewedx-ray on May 11 2016, there wasno significant abnormality, he still has neuropathic pain at bilateral feet, he is taking gabapentin 1800mg  bid, oxtellar 150mg  iii bid, baclofen 30mg  tid, He sleeps a lot during the day, he has irregular night time sleep schedule. He reported  low sodiumrecent laboratory evaluations.  UPDATE Sept 10 2018: He had L4-5, L5-S1 laminectomy with facetectomy for decompression of nerve roots, placement of anterior interbody device on Oct 06 2016, which has helped his low back pain,   Now he has toes curling up, feet and leg numbness, more on left side, use walkers now, urinary urgency, more gait abnormality.  He also suffered hypoglycemia episode,  UPDATE Oct 08 2017: Electrodiagnostic study in November2018showed evidence of chronic moderate axonal peripheral neuropathy. There is also superimposed bilateral lumbar radiculopathy, left worse than right, mainly involving bilateral L4, L5, S1 myotomes. In addition, there is evidence of severe right carpal tunnel syndromes, demyelinating in nature. There is no evidence of right cervical radiculopathy.   He is doing better with physical therapy, to need to be on polypharmacy treatment, complains of drowsiness fatigue chronic axonal peripheral neuropathy 1) Trileptal 150mg , 2 tabs BID 2) Baclofen 10mg , 4 tablets TID 3) Gabapentin 600mg , 3 caps am, 2 caps midday, 3 caps qhs  UPDATE January 02 2019: He was not able to exercise since COVID-19, noticed worsening gait abnormality, bilateral lower extremity neuropathic pain from bilateral knee down, he is taking Trileptal 300 mg 4 times a day, baclofen 30 mg 4 times a day, gabapentin 1200 mg 3 times a day, polypharmacy does make him sleepy, also take Norco 1 tablets daily,  UPDATE Apr 06 2020: He continues to complain significant bilateral lower extremity paresthesia, despite polypharmacy treatment, ambulate with a cane, during Covid, is no longer able to enjoy his water aerobic, still go to the gym few times each week, but complains of worsening pain and weakness after work-up, he complains of urinary urgency, but no incontinence,  Currently taking Lyrica 150 mg 5 times a day, which is helpful, Trileptal 300 mg 4 times a day, no longer on  baclofen   REVIEW OF SYSTEMS: Out of a complete 14 system review of symptoms, the patient complains only of the following symptoms, and all other reviewed systems are negative.  As above  ALLERGIES: No Known Allergies  HOME MEDICATIONS: Outpatient Medications Prior to Visit  Medication Sig Dispense Refill  . aspirin 81 MG tablet Take 81 mg by mouth daily.    02-29-1996 atenolol (TENORMIN) 50 MG tablet Take 50 mg by mouth daily.  11  . gemfibrozil (LOPID) 600 MG tablet Take 600 mg by mouth daily.     glucose blood (ONE TOUCH ULTRA TEST) test strip Check cbgs 4 times a day 100 each 12  . HYDROcodone-acetaminophen (NORCO/VICODIN) 5-325 MG tablet Take 1 tablet by mouth daily.    losartan (COZAAR) 100 MG tablet Take 100 mg by mouth daily.     . metFORMIN (GLUCOPHAGE) 500 MG tablet Take 1,000 mg by mouth 2 (two) times daily.     . OXcarbazepine (TRILEPTAL) 150 MG tablet Take 2 tablets (300 mg total) by mouth 4 (four) times daily. 720 tablet 3  . pregabalin (LYRICA) 150 MG capsule Take 1 capsule (150 mg total) by mouth 5 (five) times daily. 150 capsule 5   No facility-administered medications prior to visit.    PAST MEDICAL HISTORY: Past Medical History:  Diagnosis Date  . Anxiety   . Arthritis   .  Diabetes (HCC)   . High cholesterol   . Neuropathic pain of lower extremity   . Sleep apnea    CPAP     over 5 years ago   does not know where done    PAST SURGICAL HISTORY: Past Surgical History:  Procedure Laterality Date  . APPLICATION OF ROBOTIC ASSISTANCE FOR SPINAL PROCEDURE N/A 10/06/2016   Procedure: APPLICATION OF ROBOTIC ASSISTANCE FOR SPINAL PROCEDURE;  Surgeon: Lisbeth Renshaw, MD;  Location: [facility] OR;  Service: Neurosurgery;  Laterality: N/A;  . Back Surgeries  2012   Total 3 back sirgeries  . BACK SURGERY     Spinal AV fistula  . EYE SURGERY Bilateral 2017   cataracts removed  . Right foot/ ankle reconstruction      FAMILY HISTORY: Family History  Problem Relation Age  of Onset  . Cancer Mother   . Cancer Father     SOCIAL HISTORY: Social History   Socioeconomic History  . Marital status: Married    Spouse name: Lupita Leash  . Number of children: 2  . Years of education: 14  . Highest education level: Not on file  Occupational History    Employer: [employer]  Tobacco Use  . Smoking status: Former Smoker    Packs/day: 0.25    Years: 35.00    Pack years: 8.75    Quit date: 10/06/2016    Years since quitting: 3.5  . Smokeless tobacco: Never Used  Vaping Use  . Vaping Use: Never used  Substance and Sexual Activity  . Alcohol use: No    Alcohol/week: 0.0 standard drinks    Comment: quit 4-5 months ago  . Drug use: No  . Sexual activity: Not on file  Other Topics Concern  . Not on file  Social History Narrative   Patient is married and lives at home with wife.    Patient has a Barista.   Patient drinks 6-8 cups of caffeine daily.    Right-handed.      Social Determinants of Health   Financial Resource Strain:   . Difficulty of Paying Living Expenses: Not on file  Food Insecurity:   . Worried About Programme researcher, broadcasting/film/video in the Last Year: Not on file  . Ran Out of Food in the Last Year: Not on file  Transportation Needs:   . Lack of Transportation (Medical): Not on file  . Lack of Transportation (Non-Medical): Not on file  Physical Activity:   . Days of Exercise per Week: Not on file  . Minutes of Exercise per Session: Not on file  Stress:   . Feeling of Stress : Not on file  Social Connections:   . Frequency of Communication with Friends and Family: Not on file  . Frequency of Social Gatherings with Friends and Family: Not on file  . Attends Religious Services: Not on file  . Active Member of Clubs or Organizations: Not on file  . Attends Banker Meetings: Not on file  . Marital Status: Not on file  Intimate Partner Violence:   . Fear of Current or Ex-Partner: Not on file  . Emotionally  Abused: Not on file  . Physically Abused: Not on file  . Sexually Abused: Not on file   PHYSICAL EXAM  Vitals:   04/06/20 0854  BP: 137/79  Pulse: 67  Weight: 293 lb 8 oz (133.1 kg)  Height: 6\' 4"  (1.93 m)   Body mass index is 35.73 kg/m.  PHYSICAL EXAMNIATION:  Gen: NAD, conversant, well nourised, well groomed                     Cardiovascular: Regular rate rhythm, no peripheral edema, warm, nontender. Eyes: Conjunctivae clear without exudates or hemorrhage Neck: Supple, no carotid bruits. Pulmonary: Clear to auscultation bilaterally   NEUROLOGICAL EXAM:  MENTAL STATUS: Speech/Cognition: Awake, alert, normal speech, oriented to history taking and casual conversation.  CRANIAL NERVES: CN II: Visual fields are full to confrontation.  Pupils are round equal and briskly reactive to light. CN III, IV, VI: extraocular movement are normal. No ptosis. CN V: Facial sensation is intact to light touch. CN VII: Face is symmetric with normal eye closure and smile. CN VIII: Hearing is normal to casual conversation CN IX, X: Palate elevates symmetrically. Phonation is normal. CN XI: Head turning and shoulder shrug are intact CN XII: Tongue is midline with normal movements and no atrophy.  MOTOR: Bilateral lower extremity mild spasticity, mild bilateral hip flexion weakness  REFLEXES: Reflexes are 2  and symmetric at the biceps, triceps, knees and ankles. Plantar responses are flexor.  SENSORY: Mild length dependent decreased light touch, vibratory sensation  COORDINATION: There is no trunk or limb ataxia.    GAIT/STANCE: He needs push-up to get up from seated position, wide-based, mildly unsteady  DIAGNOSTIC DATA (LABS, IMAGING, TESTING) - I reviewed patient records, labs, notes, testing and imaging myself where available.  Lab Results  Component Value Date   WBC 9.0 10/11/2016   HGB 11.2 (L) 10/11/2016   HCT 32.6 (L) 10/11/2016   MCV 90.1 10/11/2016   PLT  PLATELETS APPEAR ADEQUATE 10/11/2016      Component Value Date/Time   NA 126 (L) 11/10/2016 0952   K 5.2 11/10/2016 0952   CL 90 (L) 11/10/2016 0952   CO2 19 (L) 11/10/2016 0952   GLUCOSE 105 (H) 11/10/2016 0952   GLUCOSE 132 (H) 10/12/2016 0235   BUN 13 11/10/2016 0952   CREATININE 0.80 11/10/2016 0952   CALCIUM 9.5 11/10/2016 0952   PROT 7.4 03/23/2017 1012   ALBUMIN 3.0 (L) 10/12/2016 0235   AST 30 10/12/2016 0235   ALT 22 10/12/2016 0235   ALKPHOS 49 10/12/2016 0235   BILITOT 0.4 10/12/2016 0235   GFRNONAA 94 11/10/2016 0952   GFRAA 108 11/10/2016 0952   No results found for: CHOL, HDL, LDLCALC, LDLDIRECT, TRIG, CHOLHDL Lab Results  Component Value Date   HGBA1C 5.6 09/28/2016   Lab Results  Component Value Date   VITAMINB12 646 03/23/2017   Lab Results  Component Value Date   TSH 1.580 03/23/2017      ASSESSMENT AND PLAN 69 y.o. 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 Ph.D.  [facility] [address] [place], [place] [address] Phone: [phone] Fax:      [phone]

## 2020-04-09 ENCOUNTER — Other Ambulatory Visit: Payer: Self-pay

## 2020-04-09 ENCOUNTER — Encounter (INDEPENDENT_AMBULATORY_CARE_PROVIDER_SITE_OTHER): Payer: Commercial Managed Care - PPO | Admitting: Ophthalmology

## 2020-04-09 DIAGNOSIS — H43813 Vitreous degeneration, bilateral: Secondary | ICD-10-CM | POA: Diagnosis not present

## 2020-04-09 DIAGNOSIS — H35033 Hypertensive retinopathy, bilateral: Secondary | ICD-10-CM

## 2020-04-09 DIAGNOSIS — H353221 Exudative age-related macular degeneration, left eye, with active choroidal neovascularization: Secondary | ICD-10-CM | POA: Diagnosis not present

## 2020-04-09 DIAGNOSIS — I10 Essential (primary) hypertension: Secondary | ICD-10-CM | POA: Diagnosis not present

## 2020-05-11 ENCOUNTER — Encounter (INDEPENDENT_AMBULATORY_CARE_PROVIDER_SITE_OTHER): Payer: Commercial Managed Care - PPO | Admitting: Ophthalmology

## 2020-05-28 ENCOUNTER — Other Ambulatory Visit: Payer: Self-pay

## 2020-05-28 ENCOUNTER — Encounter (INDEPENDENT_AMBULATORY_CARE_PROVIDER_SITE_OTHER): Payer: Commercial Managed Care - PPO | Admitting: Ophthalmology

## 2020-05-28 DIAGNOSIS — H43813 Vitreous degeneration, bilateral: Secondary | ICD-10-CM | POA: Diagnosis not present

## 2020-05-28 DIAGNOSIS — H353221 Exudative age-related macular degeneration, left eye, with active choroidal neovascularization: Secondary | ICD-10-CM | POA: Diagnosis not present

## 2020-05-28 DIAGNOSIS — I10 Essential (primary) hypertension: Secondary | ICD-10-CM | POA: Diagnosis not present

## 2020-05-28 DIAGNOSIS — H35033 Hypertensive retinopathy, bilateral: Secondary | ICD-10-CM

## 2020-07-16 ENCOUNTER — Other Ambulatory Visit: Payer: Self-pay

## 2020-07-16 ENCOUNTER — Encounter (INDEPENDENT_AMBULATORY_CARE_PROVIDER_SITE_OTHER): Payer: Commercial Managed Care - PPO | Admitting: Ophthalmology

## 2020-07-16 DIAGNOSIS — H35033 Hypertensive retinopathy, bilateral: Secondary | ICD-10-CM | POA: Diagnosis not present

## 2020-07-16 DIAGNOSIS — H353221 Exudative age-related macular degeneration, left eye, with active choroidal neovascularization: Secondary | ICD-10-CM | POA: Diagnosis not present

## 2020-07-16 DIAGNOSIS — I10 Essential (primary) hypertension: Secondary | ICD-10-CM

## 2020-07-16 DIAGNOSIS — H43813 Vitreous degeneration, bilateral: Secondary | ICD-10-CM

## 2020-09-03 ENCOUNTER — Encounter (INDEPENDENT_AMBULATORY_CARE_PROVIDER_SITE_OTHER): Payer: Commercial Managed Care - PPO | Admitting: Ophthalmology

## 2020-09-03 ENCOUNTER — Other Ambulatory Visit: Payer: Self-pay

## 2020-09-03 DIAGNOSIS — H43813 Vitreous degeneration, bilateral: Secondary | ICD-10-CM

## 2020-09-03 DIAGNOSIS — H353221 Exudative age-related macular degeneration, left eye, with active choroidal neovascularization: Secondary | ICD-10-CM

## 2020-09-03 DIAGNOSIS — H35033 Hypertensive retinopathy, bilateral: Secondary | ICD-10-CM | POA: Diagnosis not present

## 2020-09-03 DIAGNOSIS — I10 Essential (primary) hypertension: Secondary | ICD-10-CM | POA: Diagnosis not present

## 2020-09-03 DIAGNOSIS — H353111 Nonexudative age-related macular degeneration, right eye, early dry stage: Secondary | ICD-10-CM | POA: Diagnosis not present

## 2020-09-06 ENCOUNTER — Other Ambulatory Visit: Payer: Self-pay | Admitting: Neurology

## 2020-09-25 ENCOUNTER — Other Ambulatory Visit: Payer: Self-pay | Admitting: Neurology

## 2020-09-27 ENCOUNTER — Other Ambulatory Visit: Payer: Self-pay | Admitting: Neurology

## 2020-09-27 MED ORDER — PREGABALIN 200 MG PO CAPS: ORAL_CAPSULE | ORAL | 0 refills | Status: DC

## 2020-10-04 NOTE — Progress Notes (Signed)
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 MRI thoracic spine showed T5-T11, there is intramedullary spinal cord edema, with enhancing intradural dilated venous plexus posteriorly. 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 He had angiogram by Dr. Launa Flight, angiographically no evidence of early arteriovenous shunting in the spinous axis from the craniovertebral junction to the lumbosacral region noted. No abnormal early prominent venous channels are seen in the midline or in the paramidline region of the cranial spinous axis.   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 Arlyss Queen, postsurgically, he can ambulate better, but he gradually developed bilateral lower extremity burning achy pain, bandlike sensation around his lower abdomen, urinary urgency   He also has history of hypertension, diabetes, obesity,  He is currently taking Neurontin 600 mg 2 tablets 3 times a day, baclofen 10 mg 3 times a day, he complains of gradual worsening eye lateral lower extremity achy pain, like he is walking on gravels, gait difficulty, urinary urgency, bowel urgency, occasionally incontinence. He has tried nortriptyline, up to 20 mg daily, not sure about the benefit  He has been self dosing him with titrating dose of ibuprofen, up to 4800 mg daily, tends to spend a lot of time raising his leg up, or sleeping in bed,  We have reviewed MRI thoracic in April 2015,Posterior surgical decompression changes at T5 level. Small, serpiginous flow void signals noted in the posterior intra-dural extramedullary CSF space, from T3 to T8, consistent with  spinal AVM. No intrinsic, compressive or abnormal enhancing spinal cord lesions. Compared to MRI from 08/10/10, spinal cord edema has resolved, fewer dilated blood vessels from AVM are seen, and post-surgical changes are a new finding.  MRI of lumbar spine showed multilevel degenerative disc disease, no significant foraminal, or canal stenosis.  UPDATE January 07 2015:[name] complains of constant bilateral lower extremity achy pain, he has stopped daily ibuprofen use, Celebrex 100 mg twice a day does not help, He is also taking gabapentin 1200 mg 3 times a day, Trileptal 150 mg twice a day has helpful, he is also taking baclofen 10 mg 2 tablets 3 times a day, wife reported he tends to drink alcohol before he goes to bed,  He uses CPAP machine at night, which has him sleep better.  Update May 18 2015:YY His balance is gradaully getting worse, he also complains of pain at the bottom of his feet, like walking on pebbles, he woke up frequently at night time, occasionally nocturnal urinary incontinence, daytime urinary frequency, hesitation,  He is now taking gabaentin up to 3600 mg daily, Oxtellar 150mg  iii bid and baclofen 30mg  tid.   UPDATE June 27th 2017:[name] has a lot of right hip pain, low back pain, bone pain, hurt after he walks for a while, back pain when he bending over, he can only walk 15-20 minutes, he is not active, he has not swim for 2 years, Bilateral feet feel like in fire, burning and cold sometimes.  He is now taking gabaentin up to 3600 mg daily, Oxtellar 150mg  iii bid and baclofen 30mg  tid.   UPDATE Jan 4th 2018:[name] was seen by orthopedics recently for right hip  pain,I personally reviewedx-ray on May 11 2016, there wasno significant abnormality, he still has neuropathic pain at bilateral feet, he is taking gabapentin 1800mg  bid, oxtellar 150mg  iii bid, baclofen 30mg  tid, He sleeps a lot during the day, he has irregular night time sleep schedule. He reported  low sodiumrecent laboratory evaluations.  UPDATE Sept 10 2018: He had L4-5, L5-S1 laminectomy with facetectomy for decompression of nerve roots, placement of anterior interbody device on Oct 06 2016, which has helped his low back pain,   Now he has toes curling up, feet and leg numbness, more on left side, use walkers now, urinary urgency, more gait abnormality.  He also suffered hypoglycemia episode,  UPDATE Oct 08 2017: Electrodiagnostic study in November2018showed evidence of chronic moderate axonal peripheral neuropathy. There is also superimposed bilateral lumbar radiculopathy, left worse than right, mainly involving bilateral L4, L5, S1 myotomes. In addition, there is evidence of severe right carpal tunnel syndromes, demyelinating in nature. There is no evidence of right cervical radiculopathy.   He is doing better with physical therapy, to need to be on polypharmacy treatment, complains of drowsiness fatigue chronic axonal peripheral neuropathy 1) Trileptal 150mg , 2 tabs BID 2) Baclofen 10mg , 4 tablets TID 3) Gabapentin 600mg , 3 caps am, 2 caps midday, 3 caps qhs  UPDATE January 02 2019: He was not able to exercise since COVID-19, noticed worsening gait abnormality, bilateral lower extremity neuropathic pain from bilateral knee down, he is taking Trileptal 300 mg 4 times a day, baclofen 30 mg 4 times a day, gabapentin 1200 mg 3 times a day, polypharmacy does make him sleepy, also take Norco 1 tablets daily,  UPDATE Apr 06 2020: He continues to complain significant bilateral lower extremity paresthesia, despite polypharmacy treatment, ambulate with a cane, during Covid, is no longer able to enjoy his water aerobic, still go to the gym few times each week, but complains of worsening pain and weakness after work-up, he complains of urinary urgency, but no incontinence,  Currently taking Lyrica 150 mg 5 times a day, which is helpful, Trileptal 300 mg 4 times a day, no longer on  baclofen  Update Oct 05, 2020 SS: Here today alone, remains on Lyrica 200 mg 5 times daily, gets hydrocodone 30 pills from PCP, only takes at night, usually takes 20 tablets a month. On Trileptal 150 mg, 2 tablets 4 times daily. Couldn't get back into water aerobics. Goes to gym 3 days a week. Using walking stick, no falls. Neuropathic pain knee down bilaterally, squeezing sensation. At night aching, throbbing. Stays busy during day, night is worst. Added on Cymbalta 60 mg in Nov. Didn't go to pain management, was too expensive. At night does fairly well with current regimen. In March labs reviewed from PCP creatinine was 0.91, NA 135, glucose 285, A1C 9.7.  REVIEW OF SYSTEMS: Out of a complete 14 system review of symptoms, the patient complains only of the following symptoms, and all other reviewed systems are negative.  See HPI  ALLERGIES: No Known Allergies  HOME MEDICATIONS: Outpatient Medications Prior to Visit  Medication Sig Dispense Refill  . aspirin 81 MG tablet Take 81 mg by mouth daily.    atenolol (TENORMIN) 50 MG tablet Take 50 mg by mouth daily.  11  . gemfibrozil (LOPID) 600 MG tablet Take 600 mg by mouth daily.     09-25-1994 glucose blood (ONE TOUCH ULTRA TEST) test strip Check cbgs 4 times a day 100 each 12  . HYDROcodone-acetaminophen (NORCO/VICODIN) 5-325 MG tablet  Take 1 tablet by mouth daily.    Marland Kitchen losartan (COZAAR) 100 MG tablet Take 100 mg by mouth daily.     . metFORMIN (GLUCOPHAGE) 500 MG tablet Take 1,000 mg by mouth 2 (two) times daily.     . OXcarbazepine (TRILEPTAL) 150 MG tablet TAKE 2 TABLETS (300 MG TOTAL) BY MOUTH 4 (FOUR) TIMES DAILY. 240 tablet 11  . pregabalin (LYRICA) 200 MG capsule TAKE 1 CAPSULE BY MOUTH 5 TIMES DAILY. 150 capsule 0  . DULoxetine (CYMBALTA) 60 MG capsule Take 1 capsule (60 mg total) by mouth daily. 30 capsule 12   No facility-administered medications prior to visit.    PAST MEDICAL HISTORY: Past Medical History:  Diagnosis Date  .  Anxiety   . Arthritis   . Diabetes (HCC)   . High cholesterol   . Neuropathic pain of lower extremity   . Sleep apnea    CPAP     over 5 years ago   does not know where done    PAST SURGICAL HISTORY: Past Surgical History:  Procedure Laterality Date  . APPLICATION OF ROBOTIC ASSISTANCE FOR SPINAL PROCEDURE N/A 10/06/2016   Procedure: APPLICATION OF ROBOTIC ASSISTANCE FOR SPINAL PROCEDURE;  Surgeon: Lisbeth Renshaw, MD;  Location: [facility] OR;  Service: Neurosurgery;  Laterality: N/A;  . Back Surgeries  2012   Total 3 back sirgeries  . BACK SURGERY     Spinal AV fistula  . EYE SURGERY Bilateral 2017   cataracts removed  . Right foot/ ankle reconstruction      FAMILY HISTORY: Family History  Problem Relation Age of Onset  . Cancer Mother   . Cancer Father     SOCIAL HISTORY: Social History   Socioeconomic History  . Marital status: Married    Spouse name: Lupita Leash  . Number of children: 2  . Years of education: 45  . Highest education level: Not on file  Occupational History    Employer: [employer]  Tobacco Use  . Smoking status: Former Smoker    Packs/day: 0.25    Years: 35.00    Pack years: 8.75    Quit date: 10/06/2016    Years since quitting: 4.0  . Smokeless tobacco: Never Used  Vaping Use  . Vaping Use: Never used  Substance and Sexual Activity  . Alcohol use: No    Alcohol/week: 0.0 standard drinks    Comment: quit 4-5 months ago  . Drug use: No  . Sexual activity: Not on file  Other Topics Concern  . Not on file  Social History Narrative   Patient is married and lives at home with wife.    Patient has a Barista.   Patient drinks 6-8 cups of caffeine daily.    Right-handed.      Social Determinants of Health   Financial Resource Strain: Not on file  Food Insecurity: Not on file  Transportation Needs: Not on file  Physical Activity: Not on file  Stress: Not on file  Social Connections: Not on file  Intimate Partner  Violence: Not on file   PHYSICAL EXAM  Vitals:   10/05/20 0723  BP: (!) 142/84  Pulse: 66  Weight: 226 lb (102.5 kg)  Height: 6\' 4"  (1.93 m)   Body mass index is 27.51 kg/m.   PHYSICAL EXAMNIATION:  Gen: NAD, conversant, well nourised, well groomed                     Cardiovascular: Regular  rate rhythm, no peripheral edema, warm, nontender. Pulmonary: Clear to auscultation bilaterally   NEUROLOGICAL EXAM:  MENTAL STATUS: Speech/Cognition: Awake, alert, normal speech, oriented to history taking and casual conversation.  CRANIAL NERVES: CN II: Visual fields are full to confrontation.  Pupils are round equal and briskly reactive to light. CN III, IV, VI: extraocular movement are normal. No ptosis. CN V: Facial sensation is intact to light touch. CN VII: Face is symmetric with normal eye closure and smile. CN VIII: Hearing is normal to casual conversation CN XI: Head turning and shoulder shrug are intact  MOTOR: Mild bilateral hip flexion weakness  REFLEXES: Reflexes are 2+ throughout  SENSORY: Mild length dependent decreased light touch  COORDINATION: Finger-nose-finger and heel-to-shin is normal bilaterally  GAIT/STANCE: Has to push off from seated position to stand, gait is wide-based, unsteady, cautious, leans on wall or walking stick for stability   DIAGNOSTIC DATA (LABS, IMAGING, TESTING) - I reviewed patient records, labs, notes, testing and imaging myself where available.  Lab Results  Component Value Date   WBC 9.0 10/11/2016   HGB 11.2 (L) 10/11/2016   HCT 32.6 (L) 10/11/2016   MCV 90.1 10/11/2016   PLT PLATELETS APPEAR ADEQUATE 10/11/2016      Component Value Date/Time   NA 126 (L) 11/10/2016 0952   K 5.2 11/10/2016 0952   CL 90 (L) 11/10/2016 0952   CO2 19 (L) 11/10/2016 0952   GLUCOSE 105 (H) 11/10/2016 0952   GLUCOSE 132 (H) 10/12/2016 0235   BUN 13 11/10/2016 0952   CREATININE 0.80 11/10/2016 0952   CALCIUM 9.5 11/10/2016 0952   PROT  7.4 03/23/2017 1012   ALBUMIN 3.0 (L) 10/12/2016 0235   AST 30 10/12/2016 0235   ALT 22 10/12/2016 0235   ALKPHOS 49 10/12/2016 0235   BILITOT 0.4 10/12/2016 0235   GFRNONAA 94 11/10/2016 0952   GFRAA 108 11/10/2016 0952   No results found for: CHOL, HDL, LDLCALC, LDLDIRECT, TRIG, CHOLHDL Lab Results  Component Value Date   HGBA1C 5.6 09/28/2016   Lab Results  Component Value Date   VITAMINB12 646 03/23/2017   Lab Results  Component Value Date   TSH 1.580 03/23/2017   ASSESSMENT AND PLAN 70 y.o. 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,  bilateral lower extremity neuropathic pain, low back pain status post decompression Polypharmacy treatment  -Pain fairly well controlled with current regimen -Continue Lyrica 200 mg 5 times daily, next refill due in June -Continue Trileptal 150 mg 2 tablets 4 times a day -Continue Cymbalta 60 mg daily -Encouraged exercise, better control of diabetes, recent A1c was 9.7 -Has been referred to pain management, reports was too expensive -Also on hydrocodone from PCP -Follow-up in 6 months or sooner if needed  Margie EgeSarah Jaydee Ingman, Edrick OhAGNP-C, DNP  [facility] [address], [address] [place], [place] [address] [phone]

## 2020-10-05 ENCOUNTER — Other Ambulatory Visit: Payer: Self-pay

## 2020-10-05 ENCOUNTER — Ambulatory Visit (INDEPENDENT_AMBULATORY_CARE_PROVIDER_SITE_OTHER): Payer: Commercial Managed Care - PPO | Admitting: Neurology

## 2020-10-05 ENCOUNTER — Encounter: Payer: Self-pay | Admitting: Neurology

## 2020-10-05 VITALS — BP 142/84 | HR 66 | Ht 76.0 in | Wt 226.0 lb

## 2020-10-05 DIAGNOSIS — M5104 Intervertebral disc disorders with myelopathy, thoracic region: Secondary | ICD-10-CM | POA: Diagnosis not present

## 2020-10-05 DIAGNOSIS — R269 Unspecified abnormalities of gait and mobility: Secondary | ICD-10-CM

## 2020-10-05 MED ORDER — DULOXETINE HCL 60 MG PO CPEP: 60.0000 mg | ORAL_CAPSULE | Freq: Every day | ORAL | 12 refills | Status: DC

## 2020-10-05 NOTE — Patient Instructions (Signed)
Continue current medications  Work on better control of diabetes Continue exercise,activity See you back in 6 months

## 2020-10-22 ENCOUNTER — Other Ambulatory Visit: Payer: Self-pay

## 2020-10-22 ENCOUNTER — Encounter (INDEPENDENT_AMBULATORY_CARE_PROVIDER_SITE_OTHER): Payer: Commercial Managed Care - PPO | Admitting: Ophthalmology

## 2020-10-22 DIAGNOSIS — H26491 Other secondary cataract, right eye: Secondary | ICD-10-CM

## 2020-10-24 ENCOUNTER — Other Ambulatory Visit: Payer: Self-pay | Admitting: Neurology

## 2020-10-25 NOTE — Telephone Encounter (Signed)
Per [place] registry, last filled on 09/27/2020 Pregabalin 200 Mg Capsule #150/30. Next appt pending on 04/18/21.  Will send rx refills to Sarah NP.

## 2020-10-29 ENCOUNTER — Other Ambulatory Visit: Payer: Self-pay

## 2020-10-29 ENCOUNTER — Encounter (INDEPENDENT_AMBULATORY_CARE_PROVIDER_SITE_OTHER): Payer: Commercial Managed Care - PPO | Admitting: Ophthalmology

## 2020-10-29 DIAGNOSIS — H35033 Hypertensive retinopathy, bilateral: Secondary | ICD-10-CM | POA: Diagnosis not present

## 2020-10-29 DIAGNOSIS — H353111 Nonexudative age-related macular degeneration, right eye, early dry stage: Secondary | ICD-10-CM

## 2020-10-29 DIAGNOSIS — I10 Essential (primary) hypertension: Secondary | ICD-10-CM | POA: Diagnosis not present

## 2020-10-29 DIAGNOSIS — H353221 Exudative age-related macular degeneration, left eye, with active choroidal neovascularization: Secondary | ICD-10-CM

## 2020-10-29 DIAGNOSIS — H43813 Vitreous degeneration, bilateral: Secondary | ICD-10-CM

## 2020-12-03 ENCOUNTER — Encounter (INDEPENDENT_AMBULATORY_CARE_PROVIDER_SITE_OTHER): Payer: Commercial Managed Care - PPO | Admitting: Ophthalmology

## 2021-03-16 ENCOUNTER — Telehealth: Payer: Self-pay | Admitting: Neurology

## 2021-03-16 NOTE — Telephone Encounter (Signed)
Pt called stating he is needing an anti-inflammatory medicine for the pain he is having. Pharmacy  [phone] [place], [place] - [address]. Pt is requesting a call back.

## 2021-03-16 NOTE — Telephone Encounter (Signed)
Called pt back. Having same pain that he always has had. Starts in sciatic nerve and radiates down right leg. Walking exacerbates pain. Still has hydrocodone from PCP to take prn. Has not taken this, reserves for severe pain. Has not tried any other meds recently. Confirmed he is still taking lyrica, cymbalta, trileptal as rx'd. Aware I will speak w/ Dr. Terrace Arabia to see what she recommends and call back.

## 2021-03-17 NOTE — Telephone Encounter (Signed)
Pt called wanting to know the update on this message and I informed him that the RN has stated that she will speak to the provider and when the provider gets back to RN he will receive a call back. I also reminded the pt that there is an allotted time of 24-48hrs for the nurses to get back to the pt's since he had stated that no one got back to him yesterday.  Pt verbalized understanding.

## 2021-03-17 NOTE — Telephone Encounter (Addendum)
Called pt back. Relayed Dr. Zannie Cove message. He verbalized understanding. He was recently placed on Jardiance and Ozempic for diabetes. He chose to stop Jardiance the last three days and feels pain has improved some. He put a call into PCP this am to further discuss and waiting on call back.  He is aware Dr. Zannie Cove schedule if full for the next week currently. He would like [place] to call if there are any cx to get him in for appt.

## 2021-03-17 NOTE — Telephone Encounter (Signed)
Patient is on large dose polypharmacy treatment, Lyrica 200 mg 5 times a day, Trileptal 150 mg 2 tablets 4 times a day, Cymbalta 60 mg daily,  Norco as needed  If he still cannot significant pain, may consider giving him early follow-up

## 2021-04-17 ENCOUNTER — Other Ambulatory Visit: Payer: Self-pay | Admitting: Neurology

## 2021-04-18 ENCOUNTER — Encounter: Payer: Self-pay | Admitting: Neurology

## 2021-04-18 ENCOUNTER — Telehealth: Payer: Self-pay | Admitting: Neurology

## 2021-04-18 ENCOUNTER — Ambulatory Visit: Payer: Commercial Managed Care - PPO | Admitting: Neurology

## 2021-04-18 ENCOUNTER — Ambulatory Visit (INDEPENDENT_AMBULATORY_CARE_PROVIDER_SITE_OTHER): Payer: Commercial Managed Care - PPO | Admitting: Neurology

## 2021-04-18 VITALS — BP 119/74 | HR 69 | Ht 76.0 in | Wt 291.0 lb

## 2021-04-18 DIAGNOSIS — R269 Unspecified abnormalities of gait and mobility: Secondary | ICD-10-CM

## 2021-04-18 DIAGNOSIS — M5104 Intervertebral disc disorders with myelopathy, thoracic region: Secondary | ICD-10-CM

## 2021-04-18 DIAGNOSIS — M792 Neuralgia and neuritis, unspecified: Secondary | ICD-10-CM

## 2021-04-18 MED ORDER — PREGABALIN 200 MG PO CAPS: ORAL_CAPSULE | ORAL | 5 refills | Status: DC

## 2021-04-18 MED ORDER — OXCARBAZEPINE 150 MG PO TABS: 300.0000 mg | ORAL_TABLET | Freq: Four times a day (QID) | ORAL | 11 refills | Status: DC

## 2021-04-18 NOTE — Telephone Encounter (Addendum)
Patient was seen in the office today. His pregabalin rx printed. It has been signed by MD, [facility] [place].  I also called the pharmacy at [phone] and provided it verbally on the physician's secure voicemail. The patient will be out of medication today.   I called the patient to let him know it has been sent.

## 2021-04-18 NOTE — Telephone Encounter (Signed)
Pt requesting refill for pregabalin (LYRICA) 200 MG capsule. Pharmacy [phone] [place], [place] - [address]

## 2021-04-18 NOTE — Progress Notes (Signed)
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  MRI thoracic spine showed T5-T11, there is intramedullary spinal cord edema, with enhancing intradural dilated venous plexus posteriorly.  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  He had angiogram by Dr. Launa Flight, angiographically no evidence of early arteriovenous shunting in the spinous axis from the craniovertebral junction to the lumbosacral region noted. No abnormal early prominent venous channels are seen in the midline or in the paramidline region of the cranial spinous axis.    He underwent thoracic AVM malformation introvascular embolic surgery at [facility] in 2012 by Dr. Arlyss Queen, postsurgically, he can ambulate better, but he gradually developed bilateral lower extremity burning achy pain, bandlike sensation around his lower abdomen, urinary urgency    He also has history of hypertension, diabetes, obesity,   He is currently taking Neurontin 600 mg 2 tablets 3 times a day, baclofen 10 mg 3 times a day, he complains of gradual worsening eye lateral lower extremity achy pain, like he is walking on gravels, gait difficulty, urinary urgency, bowel urgency, occasionally incontinence. He has tried nortriptyline, up to 20 mg daily, not sure about the benefit   He has been self dosing him with titrating dose of ibuprofen, up to 4800 mg daily, tends to spend a lot of time raising his leg up, or sleeping in bed,   We have reviewed MRI thoracic in April 2015, Posterior surgical decompression changes at T5 level. Small, serpiginous flow void signals noted in the posterior intra-dural extramedullary CSF space, from T3 to T8, consistent with  spinal AVM. No intrinsic, compressive or abnormal enhancing spinal cord lesions. Compared to MRI from 08/10/10, spinal cord edema has resolved, fewer dilated blood vessels from AVM are seen, and post-surgical changes are a new finding.   MRI of lumbar spine showed multilevel degenerative disc disease, no significant foraminal, or canal stenosis.   UPDATE January 07 2015:[name] complains of constant bilateral lower extremity achy pain, he has stopped daily ibuprofen use, Celebrex 100 mg twice a day does not help, He is also taking gabapentin 1200 mg 3 times a day, Trileptal 150 mg twice a day has helpful, he is also taking baclofen 10 mg 2 tablets 3 times a day, wife reported he tends to drink alcohol before he goes to bed,   He uses CPAP machine at night, which has him sleep better.   Update May 18 2015:YY His balance is gradaully getting worse, he also complains of pain at the bottom of his feet, like walking on pebbles, he woke up frequently at night time, occasionally nocturnal urinary incontinence, daytime urinary frequency, hesitation,   He is now taking gabaentin up to 3600 mg daily, Oxtellar 150mg  iii bid and baclofen 30mg  tid.    UPDATE June 27th 2017:[name] has a lot of right hip pain, low back pain, bone pain, hurt after he walks for a while, back pain when he bending over, he can only walk 15-20 minutes, he is not active, he has not swim for 2 years,  Bilateral feet feel like in fire, burning and cold sometimes.  He is now taking gabaentin up to 3600 mg daily, Oxtellar 150mg  iii bid and  baclofen 30mg  tid.    UPDATE Jan 4th 2018:[name] was seen by orthopedics recently for right hip pain, I personally reviewed x-ray on May 11 2016, there was no significant abnormality, he still has neuropathic pain at bilateral feet, he is taking gabapentin 1800mg  bid, oxtellar 150mg  iii bid, baclofen 30mg  tid,  He sleeps a lot during the day, he has irregular night time sleep schedule.  He reported  low sodium recent laboratory evaluations.   UPDATE Sept 10 2018: He had L4-5, L5-S1 laminectomy with facetectomy for decompression of nerve roots, placement of anterior interbody device on Oct 06 2016,  which has helped his low back pain,    Now he has toes curling up, feet and leg numbness, more on left side, use walkers now, urinary urgency, more gait abnormality.   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 There is also superimposed bilateral lumbar radiculopathy, left worse than right, mainly involving bilateral L4, L5, S1 myotomes.  In addition, there is evidence of severe right carpal tunnel syndromes, demyelinating in nature. There is no evidence of right cervical radiculopathy.    He is doing better with physical therapy, to need to be on polypharmacy treatment, complains of drowsiness fatigue chronic axonal peripheral neuropathy 1) Trileptal 150mg , 2 tabs BID 2) Baclofen 10mg , 4 tablets TID 3) Gabapentin 600mg , 3 caps am, 2 caps midday, 3 caps qhs   UPDATE January 02 2019: He was not able to exercise since COVID-19, noticed worsening gait abnormality, bilateral lower extremity neuropathic pain from bilateral knee down, he is taking Trileptal 300 mg 4 times a day, baclofen 30 mg 4 times a day, gabapentin 1200 mg 3 times a day, polypharmacy does make him sleepy, also take Norco 1 tablets daily,  UPDATE Apr 06 2020: He continues to complain significant bilateral lower extremity paresthesia, despite polypharmacy treatment, ambulate with a cane, during Covid, is no longer able to enjoy his water aerobic, still go to the gym few times each week, but complains of worsening pain and weakness after work-up, he complains of urinary urgency, but no incontinence,  Currently taking Lyrica 150 mg 5 times a day, which is helpful, Trileptal 300 mg 4 times a day, no longer on  baclofen  Update Apr 18 2021: He continue complains of significant bilateral lower extremity deep achy pain, especially after exercise bearing weight, denies bowel bladder incontinence, on high-dose combination polypharmacy therapy, Lyrica 200 mg 5 times a day, Trileptal 150 mg 2 tablets 4 times a day, tried Cymbalta 60 mg daily, complains of, over side effect, no longer taking it, also getting Norco 5/325 mg 1 tablet as needed from his primary care physician.  He continues to workout at the gym  REVIEW OF SYSTEMS: Out of a complete 14 system review of symptoms, the patient complains only of the following symptoms, and all other reviewed systems are negative.  See HPI  ALLERGIES: No Known Allergies  HOME MEDICATIONS: Outpatient Medications Prior to Visit  Medication Sig Dispense Refill   atenolol (TENORMIN) 50 MG tablet Take 50 mg by mouth daily.  11   DULoxetine (CYMBALTA) 60 MG capsule Take 1 capsule (60 mg total) by mouth daily. 30 capsule 12   gemfibrozil (LOPID) 600 MG tablet Take 600 mg by mouth daily.      glucose blood (ONE TOUCH ULTRA TEST) test strip Check cbgs 4 times a day 100 each 12  HYDROcodone-acetaminophen (NORCO/VICODIN) 5-325 MG tablet Take 1 tablet by mouth daily.     losartan (COZAAR) 100 MG tablet Take 100 mg by mouth daily.      OXcarbazepine (TRILEPTAL) 150 MG tablet TAKE 2 TABLETS (300 MG TOTAL) BY MOUTH 4 (FOUR) TIMES DAILY. 240 tablet 11   OZEMPIC, 0.25 OR 0.5 MG/DOSE, 2 MG/1.5ML SOPN Inject 0.5 mg into the skin once a week.     pregabalin (LYRICA) 200 MG capsule TAKE 1 CAPSULE BY MOUTH 5 TIMES DAILY. 150 capsule 5   aspirin 81 MG tablet Take 81 mg by mouth daily.     JARDIANCE 10 MG TABS tablet Take 10 mg by mouth daily. (Patient not taking: Reported on 03/17/2021)     metFORMIN (GLUCOPHAGE) 500 MG tablet Take 1,000 mg by mouth 2 (two) times daily.      No facility-administered medications prior to visit.    PAST MEDICAL HISTORY: Past Medical History:   Diagnosis Date   Anxiety    Arthritis    Diabetes (HCC)    High cholesterol    Neuropathic pain of lower extremity    Sleep apnea    CPAP     over 5 years ago   does not know where done    PAST SURGICAL HISTORY: Past Surgical History:  Procedure Laterality Date   APPLICATION OF ROBOTIC ASSISTANCE FOR SPINAL PROCEDURE N/A 10/06/2016   Procedure: APPLICATION OF ROBOTIC ASSISTANCE FOR SPINAL PROCEDURE;  Surgeon: Lisbeth Renshaw, MD;  Location: [facility] OR;  Service: Neurosurgery;  Laterality: N/A;   Back Surgeries  2012   Total 3 back sirgeries   BACK SURGERY     Spinal AV fistula   EYE SURGERY Bilateral 2017   cataracts removed   Right foot/ ankle reconstruction      FAMILY HISTORY: Family History  Problem Relation Age of Onset   Cancer Mother    Cancer Father     SOCIAL HISTORY: Social History   Socioeconomic History   Marital status: Married    Spouse name: Lupita Leash   Number of children: 2   Years of education: 12   Highest education level: Not on file  Occupational History    Employer: [employer]  Tobacco Use   Smoking status: Former    Packs/day: 0.25    Years: 35.00    Pack years: 8.75    Types: Cigarettes    Quit date: 10/06/2016    Years since quitting: 4.5   Smokeless tobacco: Never  Vaping Use   Vaping Use: Never used  Substance and Sexual Activity   Alcohol use: No    Alcohol/week: 0.0 standard drinks    Comment: quit 4-5 months ago   Drug use: No   Sexual activity: Not on file  Other Topics Concern   Not on file  Social History Narrative   Patient is married and lives at home with wife.    Patient has a Barista.   Patient drinks 6-8 cups of caffeine daily.    Right-handed.      Social Determinants of Health   Financial Resource Strain: Not on file  Food Insecurity: Not on file  Transportation Needs: Not on file  Physical Activity: Not on file  Stress: Not on file  Social Connections: Not on file  Intimate  Partner Violence: Not on file   PHYSICAL EXAM  Vitals:   04/18/21 0741  BP: 119/74  Pulse: 69  Weight: 291 lb (132 kg)  Height: 6\' 4"  (  1.93 m)   Body mass index is 35.42 kg/m.   PHYSICAL EXAMNIATION:  Gen: NAD, conversant, well nourised, well groomed                     Cardiovascular: Regular rate rhythm, no peripheral edema, warm, nontender. Pulmonary: Clear to auscultation bilaterally   NEUROLOGICAL EXAM:  MENTAL STATUS: Speech/Cognition: Awake, alert, normal speech, oriented to history taking and casual conversation.  CRANIAL NERVES: CN II: Visual fields are full to confrontation.  Pupils are round equal and briskly reactive to light. CN III, IV, VI: extraocular movement are normal. No ptosis. CN V: Facial sensation is intact to light touch. CN VII: Face is symmetric with normal eye closure and smile. CN VIII: Hearing is normal to casual conversation CN XI: Head turning and shoulder shrug are intact  MOTOR: Mild bilateral hip flexion weakness, left hammertoe, mild left ankle dorsiflexion weakness  REFLEXES: Reflexes are 2 at upper extremity, 3 at bilateral patella, absent at ankles,  SENSORY: Mild length dependent decreased light touch  COORDINATION: Finger-nose-finger and heel-to-shin is normal bilaterally  GAIT/STANCE: Has to push off from seated position to stand, gait is wide-based, unsteady, cautious, dragging left foot  DIAGNOSTIC DATA (LABS, IMAGING, TESTING) - I reviewed patient records, labs, notes, testing and imaging myself where available.  Lab Results  Component Value Date   WBC 9.0 10/11/2016   HGB 11.2 (L) 10/11/2016   HCT 32.6 (L) 10/11/2016   MCV 90.1 10/11/2016   PLT PLATELETS APPEAR ADEQUATE 10/11/2016      Component Value Date/Time   NA 126 (L) 11/10/2016 0952   K 5.2 11/10/2016 0952   CL 90 (L) 11/10/2016 0952   CO2 19 (L) 11/10/2016 0952   GLUCOSE 105 (H) 11/10/2016 0952   GLUCOSE 132 (H) 10/12/2016 0235   BUN 13  11/10/2016 0952   CREATININE 0.80 11/10/2016 0952   CALCIUM 9.5 11/10/2016 0952   PROT 7.4 03/23/2017 1012   ALBUMIN 3.0 (L) 10/12/2016 0235   AST 30 10/12/2016 0235   ALT 22 10/12/2016 0235   ALKPHOS 49 10/12/2016 0235   BILITOT 0.4 10/12/2016 0235   GFRNONAA 94 11/10/2016 0952   GFRAA 108 11/10/2016 0952   No results found for: CHOL, HDL, LDLCALC, LDLDIRECT, TRIG, CHOLHDL Lab Results  Component Value Date   HGBA1C 5.6 09/28/2016   Lab Results  Component Value Date   VITAMINB12 646 03/23/2017   Lab Results  Component Value Date   TSH 1.580 03/23/2017   ASSESSMENT AND PLAN 70 y.o. 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 Ph.D.  [facility] [address] [place], [place] [address] Phone: [phone] Fax:      [phone]

## 2021-09-08 ENCOUNTER — Telehealth: Payer: Self-pay | Admitting: Neurology

## 2021-09-08 NOTE — Telephone Encounter (Signed)
Rescheduled 6/1 appointment with pt over the phone- Kenneth Thompson out of the office. ?

## 2021-10-09 ENCOUNTER — Other Ambulatory Visit: Payer: Self-pay | Admitting: Neurology

## 2021-10-11 NOTE — Telephone Encounter (Signed)
Verify Drug Registry For Pregabalin 200 Mg Capsule Last Filled: 09/09/2021 Quantity: 150 capsules for 30 days Last appointment: 04/18/2021 Next appointment: 10/12/2021

## 2021-10-11 NOTE — Progress Notes (Unsigned)
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  MRI thoracic spine showed T5-T11, there is intramedullary spinal cord edema, with enhancing intradural dilated venous plexus posteriorly.  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  He had angiogram by Dr. Diamantina Monks, angiographically no evidence of early arteriovenous shunting in the spinous axis from the craniovertebral junction to the lumbosacral region noted. No abnormal early prominent venous channels are seen in the midline or in the paramidline region of the cranial spinous axis.    He underwent thoracic AVM malformation introvascular embolic surgery at [facility] in 2012 by Dr. Francesca Oman, postsurgically, he can ambulate better, but he gradually developed bilateral lower extremity burning achy pain, bandlike sensation around his lower abdomen, urinary urgency    He also has history of hypertension, diabetes, obesity,   He is currently taking Neurontin 600 mg 2 tablets 3 times a day, baclofen 10 mg 3 times a day, he complains of gradual worsening eye lateral lower extremity achy pain, like he is walking on gravels, gait difficulty, urinary urgency, bowel urgency, occasionally incontinence. He has tried nortriptyline, up to 20 mg daily, not sure about the benefit   He has been self dosing him with titrating dose of ibuprofen, up to 4800 mg daily, tends to spend a lot of time raising his leg up, or sleeping in bed,   We have reviewed MRI thoracic in April 2015, Posterior surgical decompression changes at T5 level. Small, serpiginous flow void signals noted in the posterior intra-dural extramedullary CSF space, from T3 to T8, consistent with spinal  AVM. No intrinsic, compressive or abnormal enhancing spinal cord lesions. Compared to MRI from 08/10/10, spinal cord edema has resolved, fewer dilated blood vessels from AVM are seen, and post-surgical changes are a new finding.   MRI of lumbar spine showed multilevel degenerative disc disease, no significant foraminal, or canal stenosis.   UPDATE January 07 2015:[name] complains of constant bilateral lower extremity achy pain, he has stopped daily ibuprofen use, Celebrex 100 mg twice a day does not help, He is also taking gabapentin 1200 mg 3 times a day, Trileptal 150 mg twice a day has helpful, he is also taking baclofen 10 mg 2 tablets 3 times a day, wife reported he tends to drink alcohol before he goes to bed,   He uses CPAP machine at night, which has him sleep better.   Update May 18 2015:YY His balance is gradaully getting worse, he also complains of pain at the bottom of his feet, like walking on pebbles, he woke up frequently at night time, occasionally nocturnal urinary incontinence, daytime urinary frequency, hesitation,   He is now taking gabaentin up to 3600 mg daily, Oxtellar 150mg  iii bid and baclofen 30mg  tid.    UPDATE June 27th 2017:[name] has a lot of right hip pain, low back pain, bone pain, hurt after he walks for a while, back pain when he bending over, he can only walk 15-20 minutes, he is not active, he has not swim for 2 years,  Bilateral feet feel like in fire, burning and cold sometimes.  He is now taking gabaentin up to 3600 mg daily, Oxtellar 150mg  iii bid and baclofen 30mg   tid.    UPDATE Jan 4th 2018:[name] was seen by orthopedics recently for right hip pain, I personally reviewed x-ray on May 11 2016, there was no significant abnormality, he still has neuropathic pain at bilateral feet, he is taking gabapentin 1800mg  bid, oxtellar 150mg  iii bid, baclofen 30mg  tid,  He sleeps a lot during the day, he has irregular night time sleep schedule.  He reported low  sodium recent laboratory evaluations.   UPDATE Sept 10 2018: He had L4-5, L5-S1 laminectomy with facetectomy for decompression of nerve roots, placement of anterior interbody device on Oct 06 2016,  which has helped his low back pain,    Now he has toes curling up, feet and leg numbness, more on left side, use walkers now, urinary urgency, more gait abnormality.   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 There is also superimposed bilateral lumbar radiculopathy, left worse than right, mainly involving bilateral L4, L5, S1 myotomes.  In addition, there is evidence of severe right carpal tunnel syndromes, demyelinating in nature. There is no evidence of right cervical radiculopathy.    He is doing better with physical therapy, to need to be on polypharmacy treatment, complains of drowsiness fatigue chronic axonal peripheral neuropathy 1) Trileptal 150mg , 2 tabs BID 2) Baclofen 10mg , 4 tablets TID 3) Gabapentin 600mg , 3 caps am, 2 caps midday, 3 caps qhs   UPDATE January 02 2019: He was not able to exercise since COVID-19, noticed worsening gait abnormality, bilateral lower extremity neuropathic pain from bilateral knee down, he is taking Trileptal 300 mg 4 times a day, baclofen 30 mg 4 times a day, gabapentin 1200 mg 3 times a day, polypharmacy does make him sleepy, also take Norco 1 tablets daily,  UPDATE Apr 06 2020: He continues to complain significant bilateral lower extremity paresthesia, despite polypharmacy treatment, ambulate with a cane, during Covid, is no longer able to enjoy his water aerobic, still go to the gym few times each week, but complains of worsening pain and weakness after work-up, he complains of urinary urgency, but no incontinence,  Currently taking Lyrica 150 mg 5 times a day, which is helpful, Trileptal 300 mg 4 times a day, no longer on  baclofen  Update Apr 18 2021: He continue complains of significant bilateral lower extremity deep achy pain, especially after exercise bearing weight, denies bowel bladder incontinence, on high-dose combination polypharmacy therapy, Lyrica 200 mg 5 times a day, Trileptal 150 mg 2 tablets 4 times a day, tried Cymbalta 60 mg daily, complains of, over side effect, no longer taking it, also getting Norco 5/325 mg 1 tablet as needed from his primary care physician.  He continues to workout at the gym  Update Oct 12, 2021 SS: having diarrhea he now thinks related to timing of starting Ozempic. Going to gym, but mostly for social interaction. Remains on Trileptal 150 mg 2 tablets 4 times a day, Lyrica 200 mg 5 times daily , takes hydrocodone from PCP about once daily. No falls, uses walking stick, left toes are hammer. He drives. If he misses doses of medication, gets deep achy pain in hips/legs. Needs handicap sticker paper completed. If he hurries, won't pick feet up. 3-4 days a he won't he might not take any medication. If he goes to the gym and works his legs, hurts the next few days.   08/16/21 Labs PCP A1C 8.8, ALT 20,  AST 21, creatinine 0.90, sodium 138.  REVIEW OF SYSTEMS: Out of a complete 14 system review of symptoms, the patient complains only of the following symptoms, and all other reviewed systems are negative.  See HPI  ALLERGIES: No Known Allergies  HOME MEDICATIONS: Outpatient Medications Prior to Visit  Medication Sig Dispense Refill   atenolol (TENORMIN) 50 MG tablet Take 50 mg by mouth daily.  11   gemfibrozil (LOPID) 600 MG tablet Take 600 mg by mouth daily.      glucose blood (ONE TOUCH ULTRA TEST) test strip Check cbgs 4 times a day 100 each 12   HYDROcodone-acetaminophen (NORCO/VICODIN) 5-325 MG tablet Take 1 tablet by mouth daily.     losartan (COZAAR) 100 MG tablet Take 100 mg by mouth daily.      OXcarbazepine (TRILEPTAL) 150 MG tablet Take 2 tablets (300 mg total) by  mouth 4 (four) times daily. 240 tablet 11   OZEMPIC, 0.25 OR 0.5 MG/DOSE, 2 MG/1.5ML SOPN Inject 0.5 mg into the skin once a week.     pregabalin (LYRICA) 200 MG capsule 1 cap 5  times a day 150 capsule 5   No facility-administered medications prior to visit.    PAST MEDICAL HISTORY: Past Medical History:  Diagnosis Date   Anxiety    Arthritis    Diabetes (HCC)    High cholesterol    Neuropathic pain of lower extremity    Sleep apnea    CPAP     over 5 years ago   does not know where done    PAST SURGICAL HISTORY: Past Surgical History:  Procedure Laterality Date   APPLICATION OF ROBOTIC ASSISTANCE FOR SPINAL PROCEDURE N/A 10/06/2016   Procedure: APPLICATION OF ROBOTIC ASSISTANCE FOR SPINAL PROCEDURE;  Surgeon: Lisbeth Renshaw, MD;  Location: [facility] OR;  Service: Neurosurgery;  Laterality: N/A;   Back Surgeries  2012   Total 3 back sirgeries   BACK SURGERY     Spinal AV fistula   EYE SURGERY Bilateral 2017   cataracts removed   Right foot/ ankle reconstruction      FAMILY HISTORY: Family History  Problem Relation Age of Onset   Cancer Mother    Cancer Father     SOCIAL HISTORY: Social History   Socioeconomic History   Marital status: Married    Spouse name: Lupita Leash   Number of children: 2   Years of education: 12   Highest education level: Not on file  Occupational History    Employer: [employer]  Tobacco Use   Smoking status: Former    Packs/day: 0.25    Years: 35.00    Pack years: 8.75    Types: Cigarettes    Quit date: 10/06/2016    Years since quitting: 5.0   Smokeless tobacco: Never  Vaping Use   Vaping Use: Never used  Substance and Sexual Activity   Alcohol use: No    Alcohol/week: 0.0 standard drinks    Comment: quit 4-5 months ago   Drug use: No   Sexual activity: Not on file  Other Topics Concern   Not on file  Social History Narrative   Patient is married and lives at home with wife.    Patient has a Barista.    Patient drinks 6-8 cups of caffeine daily.    Right-handed.      Social Determinants of Health   Financial Resource Strain: Not on file  Food Insecurity: Not on file  Transportation Needs: Not  on file  Physical Activity: Not on file  Stress: Not on file  Social Connections: Not on file  Intimate Partner Violence: Not on file   PHYSICAL EXAM  Vitals:   10/12/21 0802  BP: 125/74  Pulse: 72  Weight: 285 lb (129.3 kg)  Height: 6\' 4"  (1.93 m)    Body mass index is 34.69 kg/m.  NEUROLOGICAL EXAM:  MENTAL STATUS: Speech/Cognition: Awake, alert, normal speech, oriented to history taking and casual conversation.  CRANIAL NERVES: CN II: Visual fields are full to confrontation.  Pupils are round equal and briskly reactive to light. CN III, IV, VI: extraocular movement are normal. No ptosis. CN V: Facial sensation is intact to light touch. CN VII: Face is symmetric with normal eye closure and smile. CN VIII: Hearing is normal to casual conversation CN XI: Head turning and shoulder shrug are intact  MOTOR: Mild bilateral hip flexion weakness more on the left, left hammertoe, mild left ankle dorsiflexion weakness  REFLEXES: Reflexes are 2 at upper extremity, 3 at bilateral knees   SENSORY: Normal to soft touch face, arms, legs   COORDINATION: Finger-nose-finger and heel-to-shin is normal bilaterally  GAIT/STANCE: Has to push off from seated position to stand, gait is wide-based, cautious, drags left leg, uses walking stick, appears unsteady  DIAGNOSTIC DATA (LABS, IMAGING, TESTING) - I reviewed patient records, labs, notes, testing and imaging myself where available.  Lab Results  Component Value Date   WBC 9.0 10/11/2016   HGB 11.2 (L) 10/11/2016   HCT 32.6 (L) 10/11/2016   MCV 90.1 10/11/2016   PLT PLATELETS APPEAR ADEQUATE 10/11/2016      Component Value Date/Time   NA 126 (L) 11/10/2016 0952   K 5.2 11/10/2016 0952   CL 90 (L) 11/10/2016 0952   CO2 19 (L)  11/10/2016 0952   GLUCOSE 105 (H) 11/10/2016 0952   GLUCOSE 132 (H) 10/12/2016 0235   BUN 13 11/10/2016 0952   CREATININE 0.80 11/10/2016 0952   CALCIUM 9.5 11/10/2016 0952   PROT 7.4 03/23/2017 1012   ALBUMIN 3.0 (L) 10/12/2016 0235   AST 30 10/12/2016 0235   ALT 22 10/12/2016 0235   ALKPHOS 49 10/12/2016 0235   BILITOT 0.4 10/12/2016 0235   GFRNONAA 94 11/10/2016 0952   GFRAA 108 11/10/2016 0952   No results found for: CHOL, HDL, LDLCALC, LDLDIRECT, TRIG, CHOLHDL Lab Results  Component Value Date   HGBA1C 5.6 09/28/2016   Lab Results  Component Value Date   VITAMINB12 646 03/23/2017   Lab Results  Component Value Date   TSH 1.580 03/23/2017   ASSESSMENT AND PLAN 71 y.o. year old male    1.  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  Bilateral lower extremity neuropathic pain,  3.  Low back pain status post decompression for left lumbar radiculopathy 4.  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

## 2021-10-12 ENCOUNTER — Ambulatory Visit (INDEPENDENT_AMBULATORY_CARE_PROVIDER_SITE_OTHER): Payer: Commercial Managed Care - PPO | Admitting: Neurology

## 2021-10-12 VITALS — BP 125/74 | HR 72 | Ht 76.0 in | Wt 285.0 lb

## 2021-10-12 DIAGNOSIS — M792 Neuralgia and neuritis, unspecified: Secondary | ICD-10-CM

## 2021-10-12 DIAGNOSIS — M5104 Intervertebral disc disorders with myelopathy, thoracic region: Secondary | ICD-10-CM

## 2021-10-12 DIAGNOSIS — M5416 Radiculopathy, lumbar region: Secondary | ICD-10-CM | POA: Diagnosis not present

## 2021-10-12 DIAGNOSIS — R269 Unspecified abnormalities of gait and mobility: Secondary | ICD-10-CM

## 2021-10-12 MED ORDER — OXCARBAZEPINE 150 MG PO TABS: 300.0000 mg | ORAL_TABLET | Freq: Four times a day (QID) | ORAL | 11 refills | Status: DC

## 2021-10-20 ENCOUNTER — Ambulatory Visit: Payer: Commercial Managed Care - PPO | Admitting: Neurology

## 2022-02-10 ENCOUNTER — Other Ambulatory Visit: Payer: Self-pay | Admitting: Neurology

## 2022-02-10 NOTE — Telephone Encounter (Signed)
Pt is calling. Requesting a refill on pregabalin (LYRICA) 200 MG capsule. Requesting refill be sent to [id number] [place]. Pt said he only have enough medication for four days.

## 2022-02-13 MED ORDER — PREGABALIN 200 MG PO CAPS: ORAL_CAPSULE | ORAL | 1 refills | Status: DC

## 2022-02-13 NOTE — Telephone Encounter (Signed)
Verify Drug Registry For Pregabalin 200 Mg Capsule Last Filled: 01/09/2022 Quantity: 150 capsules for 30 days Last appointment: 10/12/2020 Next appointment: 04/19/2022

## 2022-04-10 ENCOUNTER — Other Ambulatory Visit: Payer: Self-pay | Admitting: Neurology

## 2022-04-10 MED ORDER — PREGABALIN 200 MG PO CAPS: ORAL_CAPSULE | ORAL | 1 refills | Status: DC

## 2022-04-10 NOTE — Telephone Encounter (Signed)
Pt is calling. Requesting a refill on medication pregabalin (LYRICA) 200 MG capsule. Refill should be sent to [id number] [place], [place] - [address]

## 2022-04-10 NOTE — Telephone Encounter (Signed)
[place] drug registry reviewed. Last refill 03/14/2022 # 150 for a 30 day supply. Last f/u 10/12/2021

## 2022-04-11 ENCOUNTER — Telehealth: Payer: Self-pay | Admitting: Neurology

## 2022-04-11 NOTE — Telephone Encounter (Signed)
Pt is calling. Stated he called in a refill on yesterday for medication  pregabalin (LYRICA) 200 MG capsule. Stated he was going out of town tomorrow and needed his medication.

## 2022-04-12 NOTE — Telephone Encounter (Signed)
Pt is calling. Stated the pharmacy told him they never received a fax. Pt said he needs his medication today because he is going out of town.

## 2022-04-12 NOTE — Addendum Note (Signed)
Addended by: Ann Maki on: 04/12/2022 11:18 AM   Modules accepted: Orders

## 2022-04-12 NOTE — Telephone Encounter (Addendum)
I called [facility] and spoke with May and provided verbal rx for Lyrica 200 mg cap # 150 with 1 refill. Pharmacy will process this order for the pt.  I called pt back and updated we did verbal  rx. He verbalized understanding and appreciation for the call.

## 2022-04-19 ENCOUNTER — Telehealth: Payer: Self-pay | Admitting: Neurology

## 2022-04-19 ENCOUNTER — Ambulatory Visit (INDEPENDENT_AMBULATORY_CARE_PROVIDER_SITE_OTHER): Payer: Commercial Managed Care - PPO | Admitting: Neurology

## 2022-04-19 ENCOUNTER — Encounter: Payer: Self-pay | Admitting: Neurology

## 2022-04-19 VITALS — BP 138/85 | HR 73 | Ht 76.0 in | Wt 279.1 lb

## 2022-04-19 DIAGNOSIS — M5416 Radiculopathy, lumbar region: Secondary | ICD-10-CM

## 2022-04-19 DIAGNOSIS — G5793 Unspecified mononeuropathy of bilateral lower limbs: Secondary | ICD-10-CM

## 2022-04-19 DIAGNOSIS — M5104 Intervertebral disc disorders with myelopathy, thoracic region: Secondary | ICD-10-CM | POA: Diagnosis not present

## 2022-04-19 DIAGNOSIS — M792 Neuralgia and neuritis, unspecified: Secondary | ICD-10-CM

## 2022-04-19 DIAGNOSIS — R269 Unspecified abnormalities of gait and mobility: Secondary | ICD-10-CM

## 2022-04-19 NOTE — Telephone Encounter (Signed)
Referral for pain clinic email to [facility] at [email or web address]. Phone: [phone], Fax: [phone]

## 2022-04-19 NOTE — Progress Notes (Signed)
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  MRI thoracic spine showed T5-T11, there is intramedullary spinal cord edema, with enhancing intradural dilated venous plexus posteriorly.  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  He had angiogram by Dr. Diamantina Monks, angiographically no evidence of early arteriovenous shunting in the spinous axis from the craniovertebral junction to the lumbosacral region noted. No abnormal early prominent venous channels are seen in the midline or in the paramidline region of the cranial spinous axis.    He underwent thoracic AVM malformation introvascular embolic surgery at [facility] in 2012 by Dr. Francesca Oman, postsurgically, he can ambulate better, but he gradually developed bilateral lower extremity burning achy pain, bandlike sensation around his lower abdomen, urinary urgency    He also has history of hypertension, diabetes, obesity,   He is currently taking Neurontin 600 mg 2 tablets 3 times a day, baclofen 10 mg 3 times a day, he complains of gradual worsening eye lateral lower extremity achy pain, like he is walking on gravels, gait difficulty, urinary urgency, bowel urgency, occasionally incontinence. He has tried nortriptyline, up to 20 mg daily, not sure about the benefit   He has been self dosing him with titrating dose of ibuprofen, up to 4800 mg daily, tends to spend a lot of time raising his leg up, or sleeping in bed,   We have reviewed MRI thoracic in April 2015, Posterior surgical decompression changes at T5 level. Small, serpiginous flow void signals noted in the posterior intra-dural extramedullary CSF space, from T3 to T8, consistent with spinal  AVM. No intrinsic, compressive or abnormal enhancing spinal cord lesions. Compared to MRI from 08/10/10, spinal cord edema has resolved, fewer dilated blood vessels from AVM are seen, and post-surgical changes are a new finding.   MRI of lumbar spine showed multilevel degenerative disc disease, no significant foraminal, or canal stenosis.   UPDATE January 07 2015:[name] complains of constant bilateral lower extremity achy pain, he has stopped daily ibuprofen use, Celebrex 100 mg twice a day does not help, He is also taking gabapentin 1200 mg 3 times a day, Trileptal 150 mg twice a day has helpful, he is also taking baclofen 10 mg 2 tablets 3 times a day, wife reported he tends to drink alcohol before he goes to bed,   He uses CPAP machine at night, which has him sleep better.   Update May 18 2015:YY His balance is gradaully getting worse, he also complains of pain at the bottom of his feet, like walking on pebbles, he woke up frequently at night time, occasionally nocturnal urinary incontinence, daytime urinary frequency, hesitation,   He is now taking gabaentin up to 3600 mg daily, Oxtellar 150mg  iii bid and baclofen 30mg  tid.    UPDATE June 27th 2017:[name] has a lot of right hip pain, low back pain, bone pain, hurt after he walks for a while, back pain when he bending over, he can only walk 15-20 minutes, he is not active, he has not swim for 2 years,  Bilateral feet feel like in fire, burning and cold sometimes.  He is now taking gabaentin up to 3600 mg daily, Oxtellar 150mg  iii bid and baclofen 30mg   tid.    UPDATE Jan 4th 2018:[name] was seen by orthopedics recently for right hip pain, I personally reviewed x-ray on May 11 2016, there was no significant abnormality, he still has neuropathic pain at bilateral feet, he is taking gabapentin 1800mg  bid, oxtellar 150mg  iii bid, baclofen 30mg  tid,  He sleeps a lot during the day, he has irregular night time sleep schedule.  He reported low  sodium recent laboratory evaluations.   UPDATE Sept 10 2018: He had L4-5, L5-S1 laminectomy with facetectomy for decompression of nerve roots, placement of anterior interbody device on Oct 06 2016,  which has helped his low back pain,    Now he has toes curling up, feet and leg numbness, more on left side, use walkers now, urinary urgency, more gait abnormality.   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 There is also superimposed bilateral lumbar radiculopathy, left worse than right, mainly involving bilateral L4, L5, S1 myotomes.  In addition, there is evidence of severe right carpal tunnel syndromes, demyelinating in nature. There is no evidence of right cervical radiculopathy.    He is doing better with physical therapy, to need to be on polypharmacy treatment, complains of drowsiness fatigue chronic axonal peripheral neuropathy 1) Trileptal 150mg , 2 tabs BID 2) Baclofen 10mg , 4 tablets TID 3) Gabapentin 600mg , 3 caps am, 2 caps midday, 3 caps qhs   UPDATE January 02 2019: He was not able to exercise since COVID-19, noticed worsening gait abnormality, bilateral lower extremity neuropathic pain from bilateral knee down, he is taking Trileptal 300 mg 4 times a day, baclofen 30 mg 4 times a day, gabapentin 1200 mg 3 times a day, polypharmacy does make him sleepy, also take Norco 1 tablets daily,  UPDATE Apr 06 2020: He continues to complain significant bilateral lower extremity paresthesia, despite polypharmacy treatment, ambulate with a cane, during Covid, is no longer able to enjoy his water aerobic, still go to the gym few times each week, but complains of worsening pain and weakness after work-up, he complains of urinary urgency, but no incontinence,  Currently taking Lyrica 150 mg 5 times a day, which is helpful, Trileptal 300 mg 4 times a day, no longer on  baclofen  Update Apr 18 2021: He continue complains of significant bilateral lower extremity deep achy pain, especially after exercise bearing weight, denies bowel bladder incontinence, on high-dose combination polypharmacy therapy, Lyrica 200 mg 5 times a day, Trileptal 150 mg 2 tablets 4 times a day, tried Cymbalta 60 mg daily, complains of, over side effect, no longer taking it, also getting Norco 5/325 mg 1 tablet as needed from his primary care physician.  He continues to workout at the gym  Update Oct 12, 2021 SS: having diarrhea he now thinks related to timing of starting Ozempic. Going to gym, but mostly for social interaction. Remains on Trileptal 150 mg 2 tablets 4 times a day, Lyrica 200 mg 5 times daily , takes hydrocodone from PCP about once daily. No falls, uses walking stick, left toes are hammer. He drives. If he misses doses of medication, gets deep achy pain in hips/legs. Needs handicap sticker paper completed. If he hurries, won't pick feet up. 3-4 days a he won't he might not take any medication. If he goes to the gym and works his legs, hurts the next few days.   08/16/21 Labs PCP A1C 8.8, ALT 20,  AST 21, creatinine 0.90, sodium 138.  Update April 19, 2022 SS: Remains on Trileptal 150 mg, 2 tablets 4 times a day, Lyrica 200 mg 5 times a day,  has lost 22 lbs, is on Mounjaro. Mentions issues with getting Lyrica refilled every month. Right now pain is under good control if he takes his medications as prescribed. He still works out at [employer]. Numbness to knees down, hurts, burning. Pinky's go numb, no worsening, EMG 2018 right CTS, not interested in seeing hand specialist. Using walking stick, no falls. Denies B/B incontinence. With prolonged sitting soreness to buttocks.   REVIEW OF SYSTEMS: Out of a complete 14 system review of symptoms, the patient complains only of the following symptoms, and all other reviewed systems are negative.  See HPI  ALLERGIES: No  Known Allergies  HOME MEDICATIONS: Outpatient Medications Prior to Visit  Medication Sig Dispense Refill   atenolol (TENORMIN) 50 MG tablet Take 50 mg by mouth daily.  11   gemfibrozil (LOPID) 600 MG tablet Take 600 mg by mouth daily.      glucose blood (ONE TOUCH ULTRA TEST) test strip Check cbgs 4 times a day 100 each 12   HYDROcodone-acetaminophen (NORCO/VICODIN) 5-325 MG tablet Take 1 tablet by mouth daily.     losartan (COZAAR) 100 MG tablet Take 100 mg by mouth daily.      OXcarbazepine (TRILEPTAL) 150 MG tablet Take 2 tablets (300 mg total) by mouth 4 (four) times daily. 240 tablet 11   OZEMPIC, 0.25 OR 0.5 MG/DOSE, 2 MG/1.5ML SOPN Inject 0.5 mg into the skin once a week.     pregabalin (LYRICA) 200 MG capsule TAKE 1 CAPSULE BY MOUTH 5 TIMES DAILY 150 capsule 1   No facility-administered medications prior to visit.    PAST MEDICAL HISTORY: Past Medical History:  Diagnosis Date   Anxiety    Arthritis    Diabetes (HCC)    High cholesterol    Neuropathic pain of lower extremity    Sleep apnea    CPAP     over 5 years ago   does not know where done    PAST SURGICAL HISTORY: Past Surgical History:  Procedure Laterality Date   APPLICATION OF ROBOTIC ASSISTANCE FOR SPINAL PROCEDURE N/A 10/06/2016   Procedure: APPLICATION OF ROBOTIC ASSISTANCE FOR SPINAL PROCEDURE;  Surgeon: Lisbeth Renshaw, MD;  Location: [facility] OR;  Service: Neurosurgery;  Laterality: N/A;   Back Surgeries  2012   Total 3 back sirgeries   BACK SURGERY     Spinal AV fistula   EYE SURGERY Bilateral 2017   cataracts removed   Right foot/ ankle reconstruction      FAMILY HISTORY: Family History  Problem Relation Age of Onset   Cancer Mother    Cancer Father    Neuropathy Neg Hx     SOCIAL HISTORY: Social History   Socioeconomic History   Marital status: Married    Spouse name: Lupita Leash   Number of children: 2   Years of education: 12   Highest education level: Not on file  Occupational History     Employer: [employer]  Tobacco Use   Smoking status: Former    Packs/day: 0.25    Years: 35.00    Total pack years: 8.75    Types: Cigarettes    Quit date: 10/06/2016    Years since quitting: 5.5   Smokeless tobacco: Never  Vaping Use   Vaping Use: Never used  Substance  and Sexual Activity   Alcohol use: No    Alcohol/week: 0.0 standard drinks of alcohol    Comment: quit 4-5 months ago   Drug use: No   Sexual activity: Not on file  Other Topics Concern   Not on file  Social History Narrative   Patient is married and lives at home with wife.    Patient has a Copywriter, [employer].   Patient drinks 6-8 cups of caffeine daily.    Right-handed.      Social Determinants of Health   Financial Resource Strain: Not on file  Food Insecurity: Not on file  Transportation Needs: Not on file  Physical Activity: Not on file  Stress: Not on file  Social Connections: Not on file  Intimate Partner Violence: Not on file   PHYSICAL EXAM  Vitals:   04/19/22 0846  BP: 138/85  Pulse: 73  Weight: 279 lb 1.6 oz (126.6 kg)  Height: 6\' 4"  (1.93 m)   Body mass index is 33.97 kg/m.  NEUROLOGICAL EXAM:  MENTAL STATUS: Speech/Cognition: Awake, alert, normal speech, oriented to history taking and casual conversation.  CRANIAL NERVES: CN II: Visual fields are full to confrontation.  Pupils are round equal and briskly reactive to light. CN III, IV, VI: extraocular movement are normal. No ptosis. CN V: Facial sensation is intact to light touch. CN VII: Face is symmetric with normal eye closure and smile. CN VIII: Hearing is normal to casual conversation CN XI: Head turning and shoulder shrug are intact  MOTOR: Mild bilateral hip flexion weakness, mild bilateral dorsiflexion weakness  REFLEXES: Reflexes are 2 at upper extremity, 3 at bilateral knees   SENSORY: Reported decreased soft touch sensation to the right leg compared to the left, length dependent sensory deficit  to soft touch   COORDINATION: Finger-nose-finger and heel-to-shin is normal bilaterally, mild tremor with finger-nose-finger  GAIT/STANCE: Has to push off from seated position to stand, gait is wide-based, cautious,  uses walking stick, appears unsteady  DIAGNOSTIC DATA (LABS, IMAGING, TESTING) - I reviewed patient records, labs, notes, testing and imaging myself where available.  Lab Results  Component Value Date   WBC 9.0 10/11/2016   HGB 11.2 (L) 10/11/2016   HCT 32.6 (L) 10/11/2016   MCV 90.1 10/11/2016   PLT PLATELETS APPEAR ADEQUATE 10/11/2016      Component Value Date/Time   NA 126 (L) 11/10/2016 0952   K 5.2 11/10/2016 0952   CL 90 (L) 11/10/2016 0952   CO2 19 (L) 11/10/2016 0952   GLUCOSE 105 (H) 11/10/2016 0952   GLUCOSE 132 (H) 10/12/2016 0235   BUN 13 11/10/2016 0952   CREATININE 0.80 11/10/2016 0952   CALCIUM 9.5 11/10/2016 0952   PROT 7.4 03/23/2017 1012   ALBUMIN 3.0 (L) 10/12/2016 0235   AST 30 10/12/2016 0235   ALT 22 10/12/2016 0235   ALKPHOS 49 10/12/2016 0235   BILITOT 0.4 10/12/2016 0235   GFRNONAA 94 11/10/2016 0952   GFRAA 108 11/10/2016 0952   No results found for: "CHOL", "HDL", "LDLCALC", "LDLDIRECT", "TRIG", "CHOLHDL" Lab Results  Component Value Date   HGBA1C 5.6 09/28/2016   Lab Results  Component Value Date   [id number] 03/23/2017   Lab Results  Component Value Date   TSH 1.580 03/23/2017   ASSESSMENT AND PLAN 71 y.o. year old male    1.  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  Bilateral lower extremity neuropathic pain,  3.  Low  back pain status post decompression for left lumbar radiculopathy 4.  Polypharmacy treatment  -Referral to pain clinic for consideration of other pain management modalities/medication/adjustment -He receives hydrocodone from his PCP -We will continue Lyrica 200 mg 5 times daily, Trileptal 150 mg 2 tablets 4 times daily; he is on high dose of  Lyrica which is quite helpful, but I would like for him to see pain management to get a second opinion -I reviewed labs from March 2023, labs from PCP upcoming -Follow-up with me in 6 months or sooner if needed  Butler Denmark, Laqueta Jean, [facility] [address], [place], [place] [address] [phone]

## 2022-04-19 NOTE — Patient Instructions (Signed)
We will continue current medications I placed a referral for pain management, please go for a consultation  See you back in 6 months

## 2022-06-07 ENCOUNTER — Other Ambulatory Visit: Payer: Self-pay | Admitting: Neurology

## 2022-10-12 ENCOUNTER — Other Ambulatory Visit: Payer: Self-pay | Admitting: Neurology

## 2022-11-14 ENCOUNTER — Encounter: Payer: Self-pay | Admitting: Neurology

## 2022-11-14 ENCOUNTER — Ambulatory Visit (INDEPENDENT_AMBULATORY_CARE_PROVIDER_SITE_OTHER): Payer: Commercial Managed Care - PPO | Admitting: Neurology

## 2022-11-14 VITALS — BP 134/70 | HR 70 | Ht 76.0 in | Wt 277.0 lb

## 2022-11-14 DIAGNOSIS — M792 Neuralgia and neuritis, unspecified: Secondary | ICD-10-CM

## 2022-11-14 DIAGNOSIS — G5793 Unspecified mononeuropathy of bilateral lower limbs: Secondary | ICD-10-CM | POA: Diagnosis not present

## 2022-11-14 DIAGNOSIS — M5104 Intervertebral disc disorders with myelopathy, thoracic region: Secondary | ICD-10-CM | POA: Diagnosis not present

## 2022-11-14 DIAGNOSIS — R269 Unspecified abnormalities of gait and mobility: Secondary | ICD-10-CM | POA: Diagnosis not present

## 2022-11-14 NOTE — Patient Instructions (Addendum)
Check labs today  Continue to see pain management We will continue the Trileptal, but try to reduce to 300 mg 3 times daily vs 4 I will see you back in 6 months

## 2022-11-14 NOTE — Progress Notes (Signed)
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  MRI thoracic spine showed T5-T11, there is intramedullary spinal cord edema, with enhancing intradural dilated venous plexus posteriorly.  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  He had angiogram by Dr. Diamantina Monks, angiographically no evidence of early arteriovenous shunting in the spinous axis from the craniovertebral junction to the lumbosacral region noted. No abnormal early prominent venous channels are seen in the midline or in the paramidline region of the cranial spinous axis.    He underwent thoracic AVM malformation introvascular embolic surgery at [facility] in 2012 by Dr. Francesca Oman, postsurgically, he can ambulate better, but he gradually developed bilateral lower extremity burning achy pain, bandlike sensation around his lower abdomen, urinary urgency    He also has history of hypertension, diabetes, obesity,   He is currently taking Neurontin 600 mg 2 tablets 3 times a day, baclofen 10 mg 3 times a day, he complains of gradual worsening eye lateral lower extremity achy pain, like he is walking on gravels, gait difficulty, urinary urgency, bowel urgency, occasionally incontinence. He has tried nortriptyline, up to 20 mg daily, not sure about the benefit   He has been self dosing him with titrating dose of ibuprofen, up to 4800 mg daily, tends to spend a lot of time raising his leg up, or sleeping in bed,   We have reviewed MRI thoracic in April 2015, Posterior surgical decompression changes at T5 level. Small, serpiginous flow void signals noted in the posterior intra-dural extramedullary CSF space, from T3 to T8, consistent with spinal  AVM. No intrinsic, compressive or abnormal enhancing spinal cord lesions. Compared to MRI from 08/10/10, spinal cord edema has resolved, fewer dilated blood vessels from AVM are seen, and post-surgical changes are a new finding.   MRI of lumbar spine showed multilevel degenerative disc disease, no significant foraminal, or canal stenosis.   UPDATE January 07 2015:[name] complains of constant bilateral lower extremity achy pain, he has stopped daily ibuprofen use, Celebrex 100 mg twice a day does not help, He is also taking gabapentin 1200 mg 3 times a day, Trileptal 150 mg twice a day has helpful, he is also taking baclofen 10 mg 2 tablets 3 times a day, wife reported he tends to drink alcohol before he goes to bed,   He uses CPAP machine at night, which has him sleep better.   Update May 18 2015:YY His balance is gradaully getting worse, he also complains of pain at the bottom of his feet, like walking on pebbles, he woke up frequently at night time, occasionally nocturnal urinary incontinence, daytime urinary frequency, hesitation,   He is now taking gabaentin up to 3600 mg daily, Oxtellar 150mg  iii bid and baclofen 30mg  tid.    UPDATE June 27th 2017:[name] has a lot of right hip pain, low back pain, bone pain, hurt after he walks for a while, back pain when he bending over, he can only walk 15-20 minutes, he is not active, he has not swim for 2 years,  Bilateral feet feel like in fire, burning and cold sometimes.  He is now taking gabaentin up to 3600 mg daily, Oxtellar 150mg  iii bid and baclofen 30mg   tid.    UPDATE Jan 4th 2018:[name] was seen by orthopedics recently for right hip pain, I personally reviewed x-ray on May 11 2016, there was no significant abnormality, he still has neuropathic pain at bilateral feet, he is taking gabapentin 1800mg  bid, oxtellar 150mg  iii bid, baclofen 30mg  tid,  He sleeps a lot during the day, he has irregular night time sleep schedule.  He reported low  sodium recent laboratory evaluations.   UPDATE Sept 10 2018: He had L4-5, L5-S1 laminectomy with facetectomy for decompression of nerve roots, placement of anterior interbody device on Oct 06 2016,  which has helped his low back pain,    Now he has toes curling up, feet and leg numbness, more on left side, use walkers now, urinary urgency, more gait abnormality.   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 There is also superimposed bilateral lumbar radiculopathy, left worse than right, mainly involving bilateral L4, L5, S1 myotomes.  In addition, there is evidence of severe right carpal tunnel syndromes, demyelinating in nature. There is no evidence of right cervical radiculopathy.    He is doing better with physical therapy, to need to be on polypharmacy treatment, complains of drowsiness fatigue chronic axonal peripheral neuropathy 1) Trileptal 150mg , 2 tabs BID 2) Baclofen 10mg , 4 tablets TID 3) Gabapentin 600mg , 3 caps am, 2 caps midday, 3 caps qhs   UPDATE January 02 2019: He was not able to exercise since COVID-19, noticed worsening gait abnormality, bilateral lower extremity neuropathic pain from bilateral knee down, he is taking Trileptal 300 mg 4 times a day, baclofen 30 mg 4 times a day, gabapentin 1200 mg 3 times a day, polypharmacy does make him sleepy, also take Norco 1 tablets daily,  UPDATE Apr 06 2020: He continues to complain significant bilateral lower extremity paresthesia, despite polypharmacy treatment, ambulate with a cane, during Covid, is no longer able to enjoy his water aerobic, still go to the gym few times each week, but complains of worsening pain and weakness after work-up, he complains of urinary urgency, but no incontinence,  Currently taking Lyrica 150 mg 5 times a day, which is helpful, Trileptal 300 mg 4 times a day, no longer on  baclofen  Update Apr 18 2021: He continue complains of significant bilateral lower extremity deep achy pain, especially after exercise bearing weight, denies bowel bladder incontinence, on high-dose combination polypharmacy therapy, Lyrica 200 mg 5 times a day, Trileptal 150 mg 2 tablets 4 times a day, tried Cymbalta 60 mg daily, complains of, over side effect, no longer taking it, also getting Norco 5/325 mg 1 tablet as needed from his primary care physician.  He continues to workout at the gym  Update Oct 12, 2021 SS: having diarrhea he now thinks related to timing of starting Ozempic. Going to gym, but mostly for social interaction. Remains on Trileptal 150 mg 2 tablets 4 times a day, Lyrica 200 mg 5 times daily , takes hydrocodone from PCP about once daily. No falls, uses walking stick, left toes are hammer. He drives. If he misses doses of medication, gets deep achy pain in hips/legs. Needs handicap sticker paper completed. If he hurries, won't pick feet up. 3-4 days a he won't he might not take any medication. If he goes to the gym and works his legs, hurts the next few days.   08/16/21 Labs PCP A1C 8.8, ALT 20,  AST 21, creatinine 0.90, sodium 138.  Update April 19, 2022 SS: Remains on Trileptal 150 mg, 2 tablets 4 times a day, Lyrica 200 mg 5 times a day,  has lost 22 lbs, is on Mounjaro. Mentions issues with getting Lyrica refilled every month. Right now pain is under good control if he takes his medications as prescribed. He still works out at [employer]. Numbness to knees down, hurts, burning. Pinky's go numb, no worsening, EMG 2018 right CTS, not interested in seeing hand specialist. Using walking stick, no falls. Denies B/B incontinence. With prolonged sitting soreness to buttocks.   Update November 14, 2022 SS: Going to [facility], they reduced Lyrica 200 mg, from 5 pills daily down to 3 daily. Started him on Vicodin 10-325. He would prefer to go back on higher Lyrica.  Recently increased to 2 Vicodin daily. Still takes Trileptal 300 mg 4 times daily. Still staggers, falls against walls. Most pain is in his hips, leg, feet. His condition has not changed in 10 years. He mentions his CPAP "blowed up". Last sleep study was over 10 years ago, doesn't know where he went. He sleeps alone, not sure if he snores. His sleep is broken, wakes up every 2 hours. Feels non-restorative sleep, was dedicated previously to his CPAP, stopped working about a week ago. Remains Mounjaro, weight has stabilized. He continues to exercise at the gym, 3 days a week with walking stick.   REVIEW OF SYSTEMS: Out of a complete 14 system review of symptoms, the patient complains only of the following symptoms, and all other reviewed systems are negative.  See HPI  ALLERGIES: No Known Allergies  HOME MEDICATIONS: Outpatient Medications Prior to Visit  Medication Sig Dispense Refill   atenolol (TENORMIN) 50 MG tablet Take 50 mg by mouth daily.  11   gemfibrozil (LOPID) 600 MG tablet Take 600 mg by mouth daily.      glucose blood (ONE TOUCH ULTRA TEST) test strip Check cbgs 4 times a day 100 each 12   HYDROcodone-acetaminophen (NORCO/VICODIN) 5-325 MG tablet Take 2 tablets by mouth daily.     losartan (COZAAR) 100 MG tablet Take 100 mg by mouth daily.      OXcarbazepine (TRILEPTAL) 150 MG tablet TAKE 2 TABLETS (300 MG TOTAL) BY MOUTH 4 (FOUR) TIMES DAILY. 240 tablet 11   OZEMPIC, 0.25 OR 0.5 MG/DOSE, 2 MG/1.5ML SOPN Inject 0.5 mg into the skin once a week.     pregabalin (LYRICA) 200 MG capsule TAKE 1 CAPSULE BY MOUTH 5 TIMES DAILY 150 capsule 1   No facility-administered medications prior to visit.    PAST MEDICAL HISTORY: Past Medical History:  Diagnosis Date   Anxiety    Arthritis    Diabetes (HCC)    High cholesterol    Neuropathic pain of lower extremity    Sleep apnea    CPAP     over 5 years ago   does not know where done    PAST SURGICAL HISTORY: Past Surgical History:   Procedure Laterality Date   APPLICATION OF ROBOTIC ASSISTANCE FOR SPINAL PROCEDURE N/A 10/06/2016   Procedure: APPLICATION OF ROBOTIC ASSISTANCE FOR SPINAL PROCEDURE;  Surgeon: Lisbeth Renshaw, MD;  Location: [facility] OR;  Service: Neurosurgery;  Laterality: N/A;   Back Surgeries  2012   Total 3 back sirgeries   BACK SURGERY     Spinal AV fistula   EYE SURGERY Bilateral 2017   cataracts removed   Right foot/ ankle reconstruction  FAMILY HISTORY: Family History  Problem Relation Age of Onset   Cancer Mother    Cancer Father    Neuropathy Neg Hx     SOCIAL HISTORY: Social History   Socioeconomic History   Marital status: Married    Spouse name: Lupita Leash   Number of children: 2   Years of education: 12   Highest education level: Not on file  Occupational History    Employer: [employer]  Tobacco Use   Smoking status: Former    Packs/day: 0.25    Years: 35.00    Additional pack years: 0.00    Total pack years: 8.75    Types: Cigarettes    Quit date: 10/06/2016    Years since quitting: 6.1   Smokeless tobacco: Never  Vaping Use   Vaping Use: Never used  Substance and Sexual Activity   Alcohol use: No    Alcohol/week: 0.0 standard drinks of alcohol    Comment: quit 4-5 months ago   Drug use: No   Sexual activity: Not on file  Other Topics Concern   Not on file  Social History Narrative   Patient is married and lives at home with wife.    Patient has a Barista.   Caffeine none   Right-handed.      Social Determinants of Health   Financial Resource Strain: Not on file  Food Insecurity: Not on file  Transportation Needs: Not on file  Physical Activity: Not on file  Stress: Not on file  Social Connections: Not on file  Intimate Partner Violence: Not on file   PHYSICAL EXAM  Vitals:   11/14/22 0817  BP: 134/70  Pulse: 70  SpO2: 96%  Weight: 277 lb (125.6 kg)  Height: 6\' 4"  (1.93 m)    Body mass index is 33.72  kg/m.  NEUROLOGICAL EXAM:  MENTAL STATUS: Speech/Cognition: Awake, alert, normal speech, oriented to history taking and casual conversation.  CRANIAL NERVES: CN II: Visual fields are full to confrontation.  Pupils are round equal and briskly reactive to light. CN III, IV, VI: extraocular movement are normal. No ptosis. CN V: Facial sensation is intact to light touch. CN VII: Face is symmetric with normal eye closure and smile. CN VIII: Hearing is normal to casual conversation CN XI: Head turning and shoulder shrug are intact  MOTOR: Mild bilateral hip flexion weakness, mild bilateral dorsiflexion weakness  REFLEXES: Reflexes are 2 at upper extremity, 3 at bilateral knees   SENSORY: Reported decreased soft touch sensation to the right leg compared to the left, length dependent sensory deficit to soft touch   COORDINATION: Finger-nose-finger and heel-to-shin is normal bilaterally, but slow with the legs  GAIT/STANCE: Has to push off from seated position to stand, gait is wide-based, cautious,  uses walking stick, appears unsteady, when initially stands, knees are bent, flexed, loosens up with walking  DIAGNOSTIC DATA (LABS, IMAGING, TESTING) - I reviewed patient records, labs, notes, testing and imaging myself where available.  Lab Results  Component Value Date   WBC 9.0 10/11/2016   HGB 11.2 (L) 10/11/2016   HCT 32.6 (L) 10/11/2016   MCV 90.1 10/11/2016   PLT PLATELETS APPEAR ADEQUATE 10/11/2016      Component Value Date/Time   NA 126 (L) 11/10/2016 0952   K 5.2 11/10/2016 0952   CL 90 (L) 11/10/2016 0952   CO2 19 (L) 11/10/2016 0952   GLUCOSE 105 (H) 11/10/2016 0952   GLUCOSE 132 (H) 10/12/2016 0235  BUN 13 11/10/2016 0952   CREATININE 0.80 11/10/2016 0952   CALCIUM 9.5 11/10/2016 0952   PROT 7.4 03/23/2017 1012   ALBUMIN 3.0 (L) 10/12/2016 0235   AST 30 10/12/2016 0235   ALT 22 10/12/2016 0235   ALKPHOS 49 10/12/2016 0235   BILITOT 0.4 10/12/2016 0235    GFRNONAA 94 11/10/2016 0952   GFRAA 108 11/10/2016 0952   No results found for: "CHOL", "HDL", "LDLCALC", "LDLDIRECT", "TRIG", "CHOLHDL" Lab Results  Component Value Date   HGBA1C 5.6 09/28/2016   Lab Results  Component Value Date   VITAMINB12 646 03/23/2017   Lab Results  Component Value Date   TSH 1.580 03/23/2017   ASSESSMENT AND PLAN 72 y.o. year old male    1.  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  Bilateral lower extremity neuropathic pain,  3.  Low back pain status post decompression for left lumbar radiculopathy 4.  Polypharmacy treatment 5.  History of OSA on CPAP  -Currently seeing pain management, dose of Lyrica has been reduced, is now on Vicodin twice daily, continues with neuropathic pain -Unclear benefit of Trileptal on 300 mg 4 times daily, will try to reduce to 3 times daily to see if any change -Need to update BMET, Trileptal level labs, CBC from PCP reviewed -Referral to sleep clinic at our office for history of OSA on CPAP, his CPAP machine is broken, he contacted his DME hold he will need to be evaluated.  He reports he was previously seen another sleep doctor it has been 8 to 10 years since last consult.  He wishes to be seen in our office and consolidate care possible -I will see him back in 6 months to see how he is doing on the lower dose Trileptal  Margie Ege, Edrick Oh, DNP  [facility] [address], [address] [place], [place] [address] [phone]

## 2022-11-18 LAB — 10-HYDROXYCARBAZEPINE: Oxcarbazepine SerPl-Mcnc: 28 ug/mL (ref 10–35)

## 2022-11-18 LAB — BASIC METABOLIC PANEL
BUN/Creatinine Ratio: 18 (ref 10–24)
BUN: 17 mg/dL (ref 8–27)
CO2: 18 mmol/L — ABNORMAL LOW (ref 20–29)
Calcium: 9.4 mg/dL (ref 8.6–10.2)
Chloride: 97 mmol/L (ref 96–106)
Creatinine, Ser: 0.92 mg/dL (ref 0.76–1.27)
Glucose: 154 mg/dL — ABNORMAL HIGH (ref 70–99)
Potassium: 5 mmol/L (ref 3.5–5.2)
Sodium: 132 mmol/L — ABNORMAL LOW (ref 134–144)
eGFR: 89 mL/min/{1.73_m2} (ref >59)

## 2023-05-31 ENCOUNTER — Encounter: Payer: Self-pay | Admitting: Neurology

## 2023-05-31 ENCOUNTER — Ambulatory Visit (INDEPENDENT_AMBULATORY_CARE_PROVIDER_SITE_OTHER): Payer: Commercial Managed Care - PPO | Admitting: Neurology

## 2023-05-31 VITALS — BP 113/66 | HR 67 | Ht 76.0 in | Wt 274.0 lb

## 2023-05-31 DIAGNOSIS — M5104 Intervertebral disc disorders with myelopathy, thoracic region: Secondary | ICD-10-CM

## 2023-05-31 DIAGNOSIS — M5416 Radiculopathy, lumbar region: Secondary | ICD-10-CM

## 2023-05-31 DIAGNOSIS — G5793 Unspecified mononeuropathy of bilateral lower limbs: Secondary | ICD-10-CM | POA: Diagnosis not present

## 2023-05-31 DIAGNOSIS — R269 Unspecified abnormalities of gait and mobility: Secondary | ICD-10-CM

## 2023-05-31 DIAGNOSIS — M792 Neuralgia and neuritis, unspecified: Secondary | ICD-10-CM

## 2023-05-31 MED ORDER — OXCARBAZEPINE 150 MG PO TABS: 300.0000 mg | ORAL_TABLET | Freq: Two times a day (BID) | ORAL | 3 refills | Status: DC

## 2023-05-31 NOTE — Patient Instructions (Addendum)
 Reduce Trileptal to 300 mg twice daily, monitor for increase in pain, check labs today

## 2023-05-31 NOTE — Progress Notes (Signed)
 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  MRI thoracic spine showed T5-T11, there is intramedullary spinal cord edema, with enhancing intradural dilated venous plexus posteriorly.  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  He had angiogram by Dr. Cindia, angiographically no evidence of early arteriovenous shunting in the spinous axis from the craniovertebral junction to the lumbosacral region noted. No abnormal early prominent venous channels are seen in the midline or in the paramidline region of the cranial spinous axis.    He underwent thoracic AVM malformation introvascular embolic surgery at [facility] in 2012 by Dr. Bernetta, postsurgically, he can ambulate better, but he gradually developed bilateral lower extremity burning achy pain, bandlike sensation around his lower abdomen, urinary urgency    He also has history of hypertension, diabetes, obesity,   He is currently taking Neurontin  600 mg 2 tablets 3 times a day, baclofen  10 mg 3 times a day, he complains of gradual worsening eye lateral lower extremity achy pain, like he is walking on gravels, gait difficulty, urinary urgency, bowel urgency, occasionally incontinence. He has tried nortriptyline , up to 20 mg daily, not sure about the benefit   He has been self dosing him with titrating dose of ibuprofen, up to 4800 mg daily, tends to spend a lot of time raising his leg up, or sleeping in bed,   We have reviewed MRI thoracic in April 2015, Posterior surgical decompression changes at T5 level. Small, serpiginous flow void signals noted in the posterior intra-dural extramedullary CSF space, from T3 to T8, consistent with spinal  AVM. No intrinsic, compressive or abnormal enhancing spinal cord lesions. Compared to MRI from 08/10/10, spinal cord edema has resolved, fewer dilated blood vessels from AVM are seen, and post-surgical changes are a new finding.   MRI of lumbar spine showed multilevel degenerative disc disease, no significant foraminal, or canal stenosis.   UPDATE January 07 2015:[name] complains of constant bilateral lower extremity achy pain, he has stopped daily ibuprofen use, Celebrex  100 mg twice a day does not help, He is also taking gabapentin  1200 mg 3 times a day, Trileptal  150 mg twice a day has helpful, he is also taking baclofen  10 mg 2 tablets 3 times a day, wife reported he tends to drink alcohol before he goes to bed,   He uses CPAP machine at night, which has him sleep better.   Update May 18 2015:YY His balance is gradaully getting worse, he also complains of pain at the bottom of his feet, like walking on pebbles, he woke up frequently at night time, occasionally nocturnal urinary incontinence, daytime urinary frequency, hesitation,   He is now taking gabaentin up to 3600 mg daily, Oxtellar 150mg  iii bid and baclofen  30mg  tid.    UPDATE June 27th 2017:[name] has a lot of right hip pain, low back pain, bone pain, hurt after he walks for a while, back pain when he bending over, he can only walk 15-20 minutes, he is not active, he has not swim for 2 years,  Bilateral feet feel like in fire, burning and cold sometimes.  He is now taking gabaentin up to 3600 mg daily, Oxtellar 150mg  iii bid and baclofen  30mg   tid.    UPDATE Jan 4th 2018:[name] was seen by orthopedics recently for right hip pain, I personally reviewed x-ray on May 11 2016, there was no significant abnormality, he still has neuropathic pain at bilateral feet, he is taking gabapentin  1800mg  bid, oxtellar 150mg  iii bid, baclofen  30mg  tid,  He sleeps a lot during the day, he has irregular night time sleep schedule.  He reported low  sodium recent laboratory evaluations.   UPDATE Sept 10 2018: He had L4-5, L5-S1 laminectomy with facetectomy for decompression of nerve roots, placement of anterior interbody device on Oct 06 2016,  which has helped his low back pain,    Now he has toes curling up, feet and leg numbness, more on left side, use walkers now, urinary urgency, more gait abnormality.   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 There is also superimposed bilateral lumbar radiculopathy, left worse than right, mainly involving bilateral L4, L5, S1 myotomes.  In addition, there is evidence of severe right carpal tunnel syndromes, demyelinating in nature. There is no evidence of right cervical radiculopathy.    He is doing better with physical therapy, to need to be on polypharmacy treatment, complains of drowsiness fatigue chronic axonal peripheral neuropathy 1) Trileptal  150mg , 2 tabs BID 2) Baclofen  10mg , 4 tablets TID 3) Gabapentin  600mg , 3 caps am, 2 caps midday, 3 caps qhs   UPDATE January 02 2019: He was not able to exercise since COVID-19, noticed worsening gait abnormality, bilateral lower extremity neuropathic pain from bilateral knee down, he is taking Trileptal  300 mg 4 times a day, baclofen  30 mg 4 times a day, gabapentin  1200 mg 3 times a day, polypharmacy does make him sleepy, also take Norco 1 tablets daily,  UPDATE Apr 06 2020: He continues to complain significant bilateral lower extremity paresthesia, despite polypharmacy treatment, ambulate with a cane, during Covid, is no longer able to enjoy his water aerobic, still go to the gym few times each week, but complains of worsening pain and weakness after work-up, he complains of urinary urgency, but no incontinence,  Currently taking Lyrica  150 mg 5 times a day, which is helpful, Trileptal  300 mg 4 times a day, no longer on  baclofen   Update Apr 18 2021: He continue complains of significant bilateral lower extremity deep achy pain, especially after exercise bearing weight, denies bowel bladder incontinence, on high-dose combination polypharmacy therapy, Lyrica  200 mg 5 times a day, Trileptal  150 mg 2 tablets 4 times a day, tried Cymbalta  60 mg daily, complains of, over side effect, no longer taking it, also getting Norco 5/325 mg 1 tablet as needed from his primary care physician.  He continues to workout at the gym  Update Oct 12, 2021 SS: having diarrhea he now thinks related to timing of starting Ozempic. Going to gym, but mostly for social interaction. Remains on Trileptal  150 mg 2 tablets 4 times a day, Lyrica  200 mg 5 times daily , takes hydrocodone  from PCP about once daily. No falls, uses walking stick, left toes are hammer. He drives. If he misses doses of medication, gets deep achy pain in hips/legs. Needs handicap sticker paper completed. If he hurries, won't pick feet up. 3-4 days a he won't he might not take any medication. If he goes to the gym and works his legs, hurts the next few days.   08/16/21 Labs PCP A1C 8.8, ALT 20,  AST 21, creatinine 0.90, sodium 138.  Update April 19, 2022 SS: Remains on Trileptal  150 mg, 2 tablets 4 times a day, Lyrica  200 mg 5 times a day,  has lost 22 lbs, is on Mounjaro. Mentions issues with getting Lyrica  refilled every month. Right now pain is under good control if he takes his medications as prescribed. He still works out at the [employer]. Numbness to knees down, hurts, burning. Pinky's go numb, no worsening, EMG 2018 right CTS, not interested in seeing hand specialist. Using walking stick, no falls. Denies B/B incontinence. With prolonged sitting soreness to buttocks.   Update November 14, 2022 SS: Going to [facility], they reduced Lyrica  200 mg, from 5 pills daily down to 3 daily. Started him on Vicodin 10-325. He would prefer to go back on higher Lyrica .  Recently increased to 2 Vicodin daily. Still takes Trileptal  300 mg 4 times daily. Still staggers, falls against walls. Most pain is in his hips, leg, feet. His condition has not changed in 10 years. He mentions his CPAP blowed up. Last sleep study was over 10 years ago, doesn't know where he went. He sleeps alone, not sure if he snores. His sleep is broken, wakes up every 2 hours. Feels non-restorative sleep, was dedicated previously to his CPAP, stopped working about a week ago. Remains Mounjaro, weight has stabilized. He continues to exercise at the gym, 3 days a week with walking stick.   Update May 31, 2023 SS: Continues with pain management, taking hydrocodone  every 4 hours, Lyrica  200 mg TID. He reduced Trileptal  300 mg down from 4 times a day down to 3 times a day. Hasn't noticed much change. On some days he doesn't take any pain medication.  He never pursued his sleep study, he has an old CPAP, claims not needed since he doesn't snore. Labs in June 2024 Trileptal  28, sodium was 132, glucose 154.   REVIEW OF SYSTEMS: Out of a complete 14 system review of symptoms, the patient complains only of the following symptoms, and all other reviewed systems are negative.  See HPI  ALLERGIES: No Known Allergies  HOME MEDICATIONS: Outpatient Medications Prior to Visit  Medication Sig Dispense Refill   amLODipine (NORVASC) 2.5 MG tablet Take 2.5 mg by mouth daily.     atenolol  (TENORMIN ) 50 MG tablet Take 50 mg by mouth daily.  11   gemfibrozil (LOPID) 600 MG tablet Take 600 mg by mouth daily.      glucose blood (ONE TOUCH ULTRA TEST) test strip Check cbgs 4 times a day 100 each 12   HYDROcodone -acetaminophen  (NORCO/VICODIN) 5-325 MG tablet Take 2 tablets by mouth daily.     losartan  (COZAAR ) 100 MG tablet Take 100 mg by mouth daily.      MOUNJARO 7.5 MG/0.5ML Pen SMARTSIG:7.5 Milligram(s) SUB-Q Once a Week     pregabalin  (LYRICA ) 200 MG capsule TAKE 1 CAPSULE BY MOUTH 5 TIMES DAILY 150 capsule  1   OXcarbazepine  (TRILEPTAL ) 150 MG tablet TAKE 2 TABLETS (300 MG TOTAL) BY MOUTH 4 (FOUR) TIMES DAILY. 240 tablet 11   OZEMPIC, 0.25 OR 0.5 MG/DOSE, 2 MG/1.5ML SOPN Inject 0.5 mg into the skin once a week. (Patient not taking: Reported on 05/31/2023)     No facility-administered medications prior to visit.    PAST MEDICAL HISTORY: Past Medical History:  Diagnosis Date   Anxiety    Arthritis    Diabetes (HCC)    High cholesterol    Neuropathic pain of  lower extremity    Sleep apnea    CPAP     over 5 years ago   does not know where done    PAST SURGICAL HISTORY: Past Surgical History:  Procedure Laterality Date   APPLICATION OF ROBOTIC ASSISTANCE FOR SPINAL PROCEDURE N/A 10/06/2016   Procedure: APPLICATION OF ROBOTIC ASSISTANCE FOR SPINAL PROCEDURE;  Surgeon: Lanis Pupa, MD;  Location: [facility] OR;  Service: Neurosurgery;  Laterality: N/A;   Back Surgeries  2012   Total 3 back sirgeries   BACK SURGERY     Spinal AV fistula   EYE SURGERY Bilateral 2017   cataracts removed   Right foot/ ankle reconstruction      FAMILY HISTORY: Family History  Problem Relation Age of Onset   Cancer Mother    Cancer Father    Neuropathy Neg Hx     SOCIAL HISTORY: Social History   Socioeconomic History   Marital status: Married    Spouse name: Arland   Number of children: 2   Years of education: 12   Highest education level: Not on file  Occupational History    Employer: [employer]  Tobacco Use   Smoking status: Former    Current packs/day: 0.00    Average packs/day: 0.3 packs/day for 35.0 years (8.8 ttl pk-yrs)    Types: Cigarettes    Start date: 10/06/1981    Quit date: 10/06/2016    Years since quitting: 6.6   Smokeless tobacco: Never  Vaping Use   Vaping status: Never Used  Substance and Sexual Activity   Alcohol use: No    Alcohol/week: 0.0 standard drinks of alcohol    Comment: quit 4-5 months ago   Drug use: No   Sexual activity: Not on file  Other  Topics Concern   Not on file  Social History Narrative   Patient is married and lives at home with wife.    Patient has a barista.   Caffeine none   Right-handed.      Social Drivers of Corporate Investment Banker Strain: Not on file  Food Insecurity: Not on file  Transportation Needs: Not on file  Physical Activity: Not on file  Stress: Not on file  Social Connections: Not on file  Intimate Partner Violence: Not on file   PHYSICAL EXAM  Vitals:   05/31/23 0828  BP: 113/66  Pulse: 67  Weight: 274 lb (124.3 kg)  Height: 6' 4 (1.93 m)   Body mass index is 33.35 kg/m.  NEUROLOGICAL EXAM:  MENTAL STATUS: Speech/Cognition: Awake, alert, normal speech, oriented to history taking and casual conversation.  CRANIAL NERVES: CN II: Visual fields are full to confrontation.  Pupils are round equal and briskly reactive to light. CN III, IV, VI: extraocular movement are normal. No ptosis. CN V: Facial sensation is intact to light touch. CN VII: Face is symmetric with normal eye closure and smile. CN VIII: Hearing is normal to casual conversation CN XI: Head turning and shoulder shrug are intact  MOTOR: Mild bilateral hip flexion weakness, mild bilateral dorsiflexion weakness  REFLEXES: Reflexes are 2 at upper extremity, 3 at bilateral knees   SENSORY: Intact bilaterally to soft touch to face, arms, legs  COORDINATION: Finger-nose-finger and heel-to-shin is normal bilaterally, but slow with the legs  GAIT/STANCE: Has to push off from seated position to stand, gait is wide-based, cautious,  uses walking stick, appears unsteady, when initially stands, knees are bent, flexed, loosens up with walking  DIAGNOSTIC DATA (LABS, IMAGING, TESTING) - I reviewed patient records, labs, notes, testing and imaging myself where available.  Lab Results  Component Value Date   WBC 9.0 10/11/2016   HGB 11.2 (L) 10/11/2016   HCT 32.6 (L) 10/11/2016   MCV 90.1 10/11/2016    PLT PLATELETS APPEAR ADEQUATE 10/11/2016      Component Value Date/Time   NA 132 (L) 11/14/2022 0910   K 5.0 11/14/2022 0910   CL 97 11/14/2022 0910   CO2 18 (L) 11/14/2022 0910   GLUCOSE 154 (H) 11/14/2022 0910   GLUCOSE 132 (H) 10/12/2016 0235   BUN 17 11/14/2022 0910   CREATININE 0.92 11/14/2022 0910   CALCIUM  9.4 11/14/2022 0910   PROT 7.4 03/23/2017 1012   ALBUMIN  3.0 (L) 10/12/2016 0235   AST 30 10/12/2016 0235   ALT 22 10/12/2016 0235   ALKPHOS 49 10/12/2016 0235   BILITOT 0.4 10/12/2016 0235   GFRNONAA 94 11/10/2016 0952   GFRAA 108 11/10/2016 0952   No results found for: CHOL, HDL, LDLCALC, LDLDIRECT, TRIG, CHOLHDL Lab Results  Component Value Date   HGBA1C 5.6 09/28/2016   Lab Results  Component Value Date   VITAMINB12 646 03/23/2017   Lab Results  Component Value Date   TSH 1.580 03/23/2017   ASSESSMENT AND PLAN 73 y.o. year old male    1.  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  Bilateral lower extremity neuropathic pain,  3.  Low back pain status post decompression for left lumbar radiculopathy 4.  Polypharmacy treatment 5.  History of OSA on CPAP  -Continue close follow-up with pain management, they are managing his hydrocodone  and Lyrica  -We will try to further reduce Trileptal , currently taking 300 mg 3 times daily.  He will try to reduce to 300 mg twice daily to see if any worsening in his pain. -We will update routine labs today -Encouraged to continue remain active, exercise, careful with ambulation -Follow-up with me in 1 year or sooner if needed  Lauraine Born, SCHARLENE, DNP  [facility] [address] [place], [place] [address] [phone]

## 2023-06-05 LAB — CBC WITH DIFFERENTIAL/PLATELET
Basophils Absolute: 0.1 10*3/uL (ref 0.0–0.2)
Basos: 1 %
EOS (ABSOLUTE): 0.3 10*3/uL (ref 0.0–0.4)
Eos: 4 %
Hematocrit: 41.5 % (ref 37.5–51.0)
Hemoglobin: 14.4 g/dL (ref 13.0–17.7)
Immature Grans (Abs): 0 10*3/uL (ref 0.0–0.1)
Immature Granulocytes: 0 %
Lymphocytes Absolute: 2 10*3/uL (ref 0.7–3.1)
Lymphs: 34 %
MCH: 30.8 pg (ref 26.6–33.0)
MCHC: 34.7 g/dL (ref 31.5–35.7)
MCV: 89 fL (ref 79–97)
Monocytes Absolute: 0.6 10*3/uL (ref 0.1–0.9)
Monocytes: 10 %
Neutrophils Absolute: 2.9 10*3/uL (ref 1.4–7.0)
Neutrophils: 51 %
Platelets: 182 10*3/uL (ref 150–450)
RBC: 4.68 x10E6/uL (ref 4.14–5.80)
RDW: 12.3 % (ref 11.6–15.4)
WBC: 5.8 10*3/uL (ref 3.4–10.8)

## 2023-06-05 LAB — COMPREHENSIVE METABOLIC PANEL
ALT: 22 [IU]/L (ref 0–44)
AST: 26 [IU]/L (ref 0–40)
Albumin: 5 g/dL — ABNORMAL HIGH (ref 3.8–4.8)
Alkaline Phosphatase: 82 [IU]/L (ref 44–121)
BUN/Creatinine Ratio: 17 (ref 10–24)
BUN: 16 mg/dL (ref 8–27)
Bilirubin Total: 0.3 mg/dL (ref 0.0–1.2)
CO2: 20 mmol/L (ref 20–29)
Calcium: 9.8 mg/dL (ref 8.6–10.2)
Chloride: 101 mmol/L (ref 96–106)
Creatinine, Ser: 0.96 mg/dL (ref 0.76–1.27)
Globulin, Total: 3 g/dL (ref 1.5–4.5)
Glucose: 133 mg/dL — ABNORMAL HIGH (ref 70–99)
Potassium: 4.5 mmol/L (ref 3.5–5.2)
Sodium: 138 mmol/L (ref 134–144)
Total Protein: 8 g/dL (ref 6.0–8.5)
eGFR: 84 mL/min/{1.73_m2} (ref >59)

## 2023-06-05 LAB — 10-HYDROXYCARBAZEPINE: Oxcarbazepine SerPl-Mcnc: 27 ug/mL (ref 10–35)

## 2023-08-22 ENCOUNTER — Other Ambulatory Visit: Payer: Self-pay | Admitting: Physician Assistant

## 2023-08-22 DIAGNOSIS — E1142 Type 2 diabetes mellitus with diabetic polyneuropathy: Secondary | ICD-10-CM

## 2023-09-10 ENCOUNTER — Ambulatory Visit (INDEPENDENT_AMBULATORY_CARE_PROVIDER_SITE_OTHER): Admitting: Podiatry

## 2023-09-10 DIAGNOSIS — L600 Ingrowing nail: Secondary | ICD-10-CM | POA: Diagnosis not present

## 2023-09-10 DIAGNOSIS — M79675 Pain in left toe(s): Secondary | ICD-10-CM

## 2023-09-10 DIAGNOSIS — B351 Tinea unguium: Secondary | ICD-10-CM

## 2023-09-10 DIAGNOSIS — E1142 Type 2 diabetes mellitus with diabetic polyneuropathy: Secondary | ICD-10-CM

## 2023-09-10 DIAGNOSIS — M79674 Pain in right toe(s): Secondary | ICD-10-CM

## 2023-09-10 NOTE — Progress Notes (Signed)
 Subjective:  Patient ID: Kenneth Thompson, male    DOB: 10/05/1950,   MRN: [id number]  Chief Complaint  Patient presents with   Toe Pain    Right foot 3rd toe  Pt stated that he saw his primary doctor on 4/2  and he gave him some antibiotics for his toe but he stated that it is still red and swollen      73 y.o. male presents for concern of right third toe and diabetic foot check.  Relates about a month ago he started getting redness swelling in his right third toe. Denies pain as he is neuropathic. Relates he has been on antibiotics but still getting redness and swelling. Also concern of thickened elongated and painful nails that are difficult to trim. Requesting to have them trimmed today. Relates burning and tingling in their feet. Patient is diabetic and last A1c was  Lab Results  Component Value Date   HGBA1C 5.6 09/28/2016   .   PCP:  Elester Grim, MD     Past Medical History:  Diagnosis Date   Anxiety    Arthritis    Diabetes (HCC)    High cholesterol    Neuropathic pain of lower extremity    Sleep apnea    CPAP     over 5 years ago   does not know where done    Objective:  Physical Exam: Vascular: DP/PT pulses 2/4 bilateral. CFT <3 seconds. Absent hair growth on digits. Edema noted to bilateral lower extremities. Xerosis noted bilaterally.  Skin. No lacerations or abrasions bilateral feet. Nails 1-5 bilateral  are thickened discolored and elongated with subungual debris. Right third digit nail erythema and edemna and nail loose from nail plate.  Musculoskeletal: MMT 5/5 bilateral lower extremities in DF, PF, Inversion and Eversion. Deceased ROM in DF of ankle joint.  Neurological: Sensation intact to light touch. Protective sensation diminished bilateral.    Assessment:   1. Ingrown toenail   2. Pain due to onychomycosis of toenails of both feet   3. Type 2 diabetes mellitus with peripheral neuropathy (HCC)      Plan:  Patient was evaluated and treated and  all questions answered. -Discussed and educated patient on diabetic foot care, especially with  regards to the vascular, neurological and musculoskeletal systems.  -Stressed the importance of good glycemic control and the detriment of not  controlling glucose levels in relation to the foot. -Discussed supportive shoes at all times and checking feet regularly.  -Mechanically debrided all nails 1-5 bilateral using sterile nail nipper and filed with dremel without incident  -Right third digit nail avulsed as well. Procedure below.  -Answered all patient questions   Discussed ingrown toenails etiology and treatment options including procedure for removal vs conservative care.  Patient requesting removal of ingrown nail today. Procedure below.  Discussed procedure and post procedure care and patient expressed understanding.  Will follow-up in 2 weeks for nail check or sooner if any problems arise.    Procedure:  Procedure: total Nail Avulsion of right digit nail.  Surgeon: Jennefer Moats, DPM  Pre-op Dx: Ingrown toenail with infection Post-op: Same  Place of Surgery: Office exam room.  Indications for surgery: Painful and ingrown toenail.    The patient is requesting removal of nail without  chemical matrixectomy. Risks and complications were discussed with the patient for which they understand and written consent was obtained. No block necessary to due neuropathy  The skin was prepped in sterile fashion. A tourniquet  was then applied. Next the entire hallux nail was removed and area copiously irrigated. Silvadene was applied. A dry sterile dressing was applied. After application of the dressing the tourniquet was removed and there is found to be an immediate capillary refill time to the digit. The patient tolerated the procedure well without any complications. Post procedure instructions were discussed the patient for which he verbally understood. Follow-up in two weeks for nail check or sooner  if any problems are to arise. Discussed signs/symptoms of infection and directed to call the office immediately should any occur or go directly to the emergency room. In the meantime, encouraged to call the office with any questions, concerns, changes symptoms.    Jennefer Moats, DPM

## 2023-09-10 NOTE — Patient Instructions (Signed)

## 2023-09-25 ENCOUNTER — Ambulatory Visit (INDEPENDENT_AMBULATORY_CARE_PROVIDER_SITE_OTHER): Admitting: Podiatry

## 2023-09-25 ENCOUNTER — Encounter: Payer: Self-pay | Admitting: Podiatry

## 2023-09-25 DIAGNOSIS — E1142 Type 2 diabetes mellitus with diabetic polyneuropathy: Secondary | ICD-10-CM | POA: Diagnosis not present

## 2023-09-25 DIAGNOSIS — L600 Ingrowing nail: Secondary | ICD-10-CM

## 2023-09-25 NOTE — Progress Notes (Signed)
  Subjective:  Patient ID: Kenneth Thompson, male    DOB: 01/07/1951,   MRN: [id number]  Chief Complaint  Patient presents with   Nail Problem     Pt presents for a follow up of right third toe states he is doing well.    73 y.o. male presents for follow-up of ingrown right third nail removal. Doing well. . Relates burning and tingling in their feet. Patient is diabetic and last A1c was  Lab Results  Component Value Date   HGBA1C 5.6 09/28/2016   .   PCP:  Elester Grim, MD     Past Medical History:  Diagnosis Date   Anxiety    Arthritis    Diabetes (HCC)    High cholesterol    Neuropathic pain of lower extremity    Sleep apnea    CPAP     over 5 years ago   does not know where done    Objective:  Physical Exam: Vascular: DP/PT pulses 2/4 bilateral. CFT <3 seconds. Absent hair growth on digits. Edema noted to bilateral lower extremities. Xerosis noted bilaterally.  Skin. No lacerations or abrasions bilateral feet. Nails 1-5 bilateral  are thickened discolored and elongated with subungual debris. Right third digit nail bed healing well.  Musculoskeletal: MMT 5/5 bilateral lower extremities in DF, PF, Inversion and Eversion. Deceased ROM in DF of ankle joint.  Neurological: Sensation intact to light touch. Protective sensation diminished bilateral.    Assessment:   1. Ingrown toenail   2. Type 2 diabetes mellitus with peripheral neuropathy (HCC)       Plan:  Patient was evaluated and treated and all questions answered. Toe was evaluated and appears to be healing well.  May discontinue soaks and neosporin.  Patient to follow-up in 3 months for rfc.     Jennefer Moats, DPM

## 2023-10-13 ENCOUNTER — Other Ambulatory Visit: Payer: Self-pay | Admitting: Neurology

## 2023-11-07 ENCOUNTER — Ambulatory Visit
Admission: RE | Admit: 2023-11-07 | Discharge: 2023-11-07 | Discharge status: Home / Self Care | Source: Ambulatory Visit | Attending: Physician Assistant | Admitting: Physician Assistant

## 2023-11-07 ENCOUNTER — Other Ambulatory Visit: Payer: Self-pay | Admitting: Physician Assistant

## 2023-11-07 DIAGNOSIS — E1142 Type 2 diabetes mellitus with diabetic polyneuropathy: Secondary | ICD-10-CM

## 2023-12-26 ENCOUNTER — Ambulatory Visit: Admitting: Podiatry

## 2024-04-16 ENCOUNTER — Ambulatory Visit (INDEPENDENT_AMBULATORY_CARE_PROVIDER_SITE_OTHER): Admitting: Podiatry

## 2024-04-16 ENCOUNTER — Encounter: Payer: Self-pay | Admitting: Podiatry

## 2024-04-16 DIAGNOSIS — L03031 Cellulitis of right toe: Secondary | ICD-10-CM | POA: Diagnosis not present

## 2024-04-16 DIAGNOSIS — E1142 Type 2 diabetes mellitus with diabetic polyneuropathy: Secondary | ICD-10-CM

## 2024-04-16 MED ORDER — MUPIROCIN 2 % EX OINT: 1.0000 | TOPICAL_OINTMENT | Freq: Two times a day (BID) | CUTANEOUS | 2 refills | Status: AC

## 2024-04-16 NOTE — Progress Notes (Signed)
 Subjective:  Patient ID: Kenneth Thompson, male    DOB: 07-Dec-1950,   MRN: [id number]  Chief Complaint  Patient presents with   Nail Problem    R Great toe nail is bleeding at bottom edge x 2 weeks No incident reported. Diabetic A1c 6.1.  no anti coag    73 y.o. male presents for right great toenail bleeding that happened about 2 weeks ago.  He denies any injury.  He is diabetic and has neuropathy and believes he may have hit it on something.  He has noticed a growth around the great toe as well.  Denies any current pain.. Denies any other pedal complaints. Denies n/v/f/c.   Past Medical History:  Diagnosis Date   Anxiety    Arthritis    Diabetes (HCC)    High cholesterol    Neuropathic pain of lower extremity    Sleep apnea    CPAP     over 5 years ago   does not know where done    Objective:  Physical Exam: Vascular: DP/PT pulses 2/4 bilateral. CFT <3 seconds. Feet cold to touch. Absent hair growth on digits. Edema noted to bilateral lower extremities. Xerosis noted bilaterally.  Skin. No lacerations or abrasions bilateral feet. Nails 1-5 bilateral  are thickened discolored and elongated with subungual debris.  Right hallux nail loosened from nail bed with proximal nail fold granular tissue.  Erythema and edema noted in the area.  No purulence noted. Musculoskeletal: MMT 5/5 bilateral lower extremities in DF, PF, Inversion and Eversion. Deceased ROM in DF of ankle joint.  Neurological: Sensation intact to light touch. Protective sensation diminished bilateral.    Assessment:   1. Paronychia of great toe of right foot   2. Type 2 diabetes mellitus with peripheral neuropathy (HCC)      Plan:  Patient was evaluated and treated and all questions answered. Discussed ingrown toenails etiology and treatment options including procedure for removal vs conservative care.  Patient requesting removal of ingrown nail today. Procedure below.  Mupirocin  sent to pharmacy to apply after  procedure. Discussed procedure and post procedure care and patient expressed understanding.  Will follow-up in 2 weeks for nail check or sooner if any problems arise.    Procedure:  Procedure: total Nail Avulsion of right hallux Surgeon: Asberry Failing, DPM  Pre-op Dx: Ingrown toenail with infection Post-op: Same  Place of Surgery: Office exam room.  Indications for surgery: Painful and ingrown toenail.    The patient is requesting removal of nail without  chemical matrixectomy. Risks and complications were discussed with the patient for which they understand and written consent was obtained. Under sterile conditions the skin was prepped in sterile fashion.  No anesthesia needed due to neuropathy.  A tourniquet was then applied. Next the entire right hallux nail was removed and a area copiously irrigated. Silvadene was applied. A dry sterile dressing was applied. After application of the dressing the tourniquet was removed and there is found to be an immediate capillary refill time to the digit. The patient tolerated the procedure well without any complications. Post procedure instructions were discussed the patient for which he verbally understood. Follow-up in two weeks for nail check or sooner if any problems are to arise. Discussed signs/symptoms of infection and directed to call the office immediately should any occur or go directly to the emergency room. In the meantime, encouraged to call the office with any questions, concerns, changes symptoms.   Asberry Failing, DPM

## 2024-04-16 NOTE — Patient Instructions (Signed)

## 2024-05-05 ENCOUNTER — Encounter: Payer: Self-pay | Admitting: Podiatry

## 2024-05-05 ENCOUNTER — Ambulatory Visit (INDEPENDENT_AMBULATORY_CARE_PROVIDER_SITE_OTHER): Admitting: Podiatry

## 2024-05-05 DIAGNOSIS — E1142 Type 2 diabetes mellitus with diabetic polyneuropathy: Secondary | ICD-10-CM | POA: Diagnosis not present

## 2024-05-05 DIAGNOSIS — L600 Ingrowing nail: Secondary | ICD-10-CM | POA: Diagnosis not present

## 2024-05-05 NOTE — Progress Notes (Signed)
°  Subjective:  Patient ID: Kenneth Thompson, male    DOB: 11-12-1950,   MRN: [id number]  Chief Complaint  Patient presents with   Nail Problem    It's wonderful.    73 y.o. male presents for follow-up of right hallux nail avulsion. Relates doing well and soaking as instructed. He is diabetic.  Denies any current pain.. Denies any other pedal complaints. Denies n/v/f/c.   Past Medical History:  Diagnosis Date   Anxiety    Arthritis    Diabetes (HCC)    High cholesterol    Neuropathic pain of lower extremity    Sleep apnea    CPAP     over 5 years ago   does not know where done    Objective:  Physical Exam: Vascular: DP/PT pulses 2/4 bilateral. CFT <3 seconds. Feet cold to touch. Absent hair growth on digits. Edema noted to bilateral lower extremities. Xerosis noted bilaterally.  Skin. No lacerations or abrasions bilateral feet. Nails 1-5 bilateral  are thickened discolored and elongated with subungual debris.  Right hallux nail bed well healed Musculoskeletal: MMT 5/5 bilateral lower extremities in DF, PF, Inversion and Eversion. Deceased ROM in DF of ankle joint.  Neurological: Sensation intact to light touch. Protective sensation diminished bilateral.    Assessment:   1. Ingrown toenail   2. Type 2 diabetes mellitus with peripheral neuropathy (HCC)       Plan:  Patient was evaluated and treated and all questions answered. Toe was evaluated and appears to be healing well.  May discontinue soaks and neosporin.  Patient to follow-up for routine foot care in future.     Asberry Failing, DPM

## 2024-06-04 NOTE — Progress Notes (Signed)
 "  HISTORY OF PRESENT ILLNESS: Kenneth Thompson is a 74 years old male follow up for gait difficulty following his thoracic AVM intravascular embolic surgery at [facility] in 2012   He had gradual onset gait difficulty following his right ankle fracture in 2009, eventually was diagnosed with thoracic AVM by MRI findings.  MRI thoracic spine showed T5-T11, there is intramedullary spinal cord edema, with enhancing intradural dilated venous plexus posteriorly.  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  He had angiogram by Dr. Cindia, angiographically no evidence of early arteriovenous shunting in the spinous axis from the craniovertebral junction to the lumbosacral region noted. No abnormal early prominent venous channels are seen in the midline or in the paramidline region of the cranial spinous axis.    He underwent thoracic AVM malformation introvascular embolic surgery at [facility] in 2012 by Dr. Bernetta, postsurgically, he can ambulate better, but he gradually developed bilateral lower extremity burning achy pain, bandlike sensation around his lower abdomen, urinary urgency    He also has history of hypertension, diabetes, obesity,   He is currently taking Neurontin  600 mg 2 tablets 3 times a day, baclofen  10 mg 3 times a day, he complains of gradual worsening eye lateral lower extremity achy pain, like he is walking on gravels, gait difficulty, urinary urgency, bowel urgency, occasionally incontinence. He has tried nortriptyline , up to 20 mg daily, not sure about the benefit   He has been self dosing him with titrating dose of ibuprofen, up to 4800 mg daily, tends to spend a lot of time raising his leg up, or sleeping in bed,   We have reviewed MRI thoracic in April 2015, Posterior surgical decompression changes at T5 level. Small, serpiginous flow void signals noted in the posterior intra-dural extramedullary CSF space, from T3 to T8, consistent with spinal  AVM. No intrinsic, compressive or abnormal enhancing spinal cord lesions. Compared to MRI from 08/10/10, spinal cord edema has resolved, fewer dilated blood vessels from AVM are seen, and post-surgical changes are a new finding.   MRI of lumbar spine showed multilevel degenerative disc disease, no significant foraminal, or canal stenosis.   UPDATE January 07 2015:[name] complains of constant bilateral lower extremity achy pain, he has stopped daily ibuprofen use, Celebrex  100 mg twice a day does not help, He is also taking gabapentin  1200 mg 3 times a day, Trileptal  150 mg twice a day has helpful, he is also taking baclofen  10 mg 2 tablets 3 times a day, wife reported he tends to drink alcohol before he goes to bed,   He uses CPAP machine at night, which has him sleep better.   Update May 18 2015:YY His balance is gradaully getting worse, he also complains of pain at the bottom of his feet, like walking on pebbles, he woke up frequently at night time, occasionally nocturnal urinary incontinence, daytime urinary frequency, hesitation,   He is now taking gabaentin up to 3600 mg daily, Oxtellar 150mg  iii bid and baclofen  30mg  tid.    UPDATE June 27th 2017:[name] has a lot of right hip pain, low back pain, bone pain, hurt after he walks for a while, back pain when he bending over, he can only walk 15-20 minutes, he is not active, he has not swim for 2 years,  Bilateral feet feel like in fire, burning and cold sometimes.  He is now taking gabaentin up to 3600 mg daily, Oxtellar 150mg  iii bid and baclofen   30mg  tid.    UPDATE Jan 4th 2018:[name] was seen by orthopedics recently for right hip pain, I personally reviewed x-ray on May 11 2016, there was no significant abnormality, he still has neuropathic pain at bilateral feet, he is taking gabapentin  1800mg  bid, oxtellar 150mg  iii bid, baclofen  30mg  tid,  He sleeps a lot during the day, he has irregular night time sleep schedule.  He reported low  sodium recent laboratory evaluations.   UPDATE Sept 10 2018: He had L4-5, L5-S1 laminectomy with facetectomy for decompression of nerve roots, placement of anterior interbody device on Oct 06 2016,  which has helped his low back pain,    Now he has toes curling up, feet and leg numbness, more on left side, use walkers now, urinary urgency, more gait abnormality.   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 There is also superimposed bilateral lumbar radiculopathy, left worse than right, mainly involving bilateral L4, L5, S1 myotomes.  In addition, there is evidence of severe right carpal tunnel syndromes, demyelinating in nature. There is no evidence of right cervical radiculopathy.    He is doing better with physical therapy, to need to be on polypharmacy treatment, complains of drowsiness fatigue chronic axonal peripheral neuropathy 1) Trileptal  150mg , 2 tabs BID 2) Baclofen  10mg , 4 tablets TID 3) Gabapentin  600mg , 3 caps am, 2 caps midday, 3 caps qhs   UPDATE January 02 2019: He was not able to exercise since COVID-19, noticed worsening gait abnormality, bilateral lower extremity neuropathic pain from bilateral knee down, he is taking Trileptal  300 mg 4 times a day, baclofen  30 mg 4 times a day, gabapentin  1200 mg 3 times a day, polypharmacy does make him sleepy, also take Norco 1 tablets daily,  UPDATE Apr 06 2020: He continues to complain significant bilateral lower extremity paresthesia, despite polypharmacy treatment, ambulate with a cane, during Covid, is no longer able to enjoy his water aerobic, still go to the gym few times each week, but complains of worsening pain and weakness after work-up, he complains of urinary urgency, but no incontinence,  Currently taking Lyrica  150 mg 5 times a day, which is helpful, Trileptal  300 mg 4 times a day, no longer on  baclofen   Update Apr 18 2021: He continue complains of significant bilateral lower extremity deep achy pain, especially after exercise bearing weight, denies bowel bladder incontinence, on high-dose combination polypharmacy therapy, Lyrica  200 mg 5 times a day, Trileptal  150 mg 2 tablets 4 times a day, tried Cymbalta  60 mg daily, complains of, over side effect, no longer taking it, also getting Norco 5/325 mg 1 tablet as needed from his primary care physician.  He continues to workout at the gym  Update Oct 12, 2021 SS: having diarrhea he now thinks related to timing of starting Ozempic. Going to gym, but mostly for social interaction. Remains on Trileptal  150 mg 2 tablets 4 times a day, Lyrica  200 mg 5 times daily , takes hydrocodone  from PCP about once daily. No falls, uses walking stick, left toes are hammer. He drives. If he misses doses of medication, gets deep achy pain in hips/legs. Needs handicap sticker paper completed. If he hurries, won't pick feet up. 3-4 days a he won't he might not take any medication. If he goes to the gym and works his legs, hurts the next few days.   08/16/21 Labs PCP A1C 8.8, ALT  20, AST 21, creatinine 0.90, sodium 138.  Update April 19, 2022 SS: Remains on Trileptal  150 mg, 2 tablets 4 times a day, Lyrica  200 mg 5 times a day,  has lost 22 lbs, is on Mounjaro. Mentions issues with getting Lyrica  refilled every month. Right now pain is under good control if he takes his medications as prescribed. He still works out at the [employer]. Numbness to knees down, hurts, burning. Pinky's go numb, no worsening, EMG 2018 right CTS, not interested in seeing hand specialist. Using walking stick, no falls. Denies B/B incontinence. With prolonged sitting soreness to buttocks.   Update November 14, 2022 SS: Going to [facility], they reduced Lyrica  200 mg, from 5 pills daily down to 3 daily. Started him on Vicodin 10-325. He would prefer to go back on higher Lyrica .  Recently increased to 2 Vicodin daily. Still takes Trileptal  300 mg 4 times daily. Still staggers, falls against walls. Most pain is in his hips, leg, feet. His condition has not changed in 10 years. He mentions his CPAP blowed up. Last sleep study was over 10 years ago, doesn't know where he went. He sleeps alone, not sure if he snores. His sleep is broken, wakes up every 2 hours. Feels non-restorative sleep, was dedicated previously to his CPAP, stopped working about a week ago. Remains Mounjaro, weight has stabilized. He continues to exercise at the gym, 3 days a week with walking stick.   Update May 31, 2023 SS: Continues with pain management, taking hydrocodone  every 4 hours, Lyrica  200 mg TID. He reduced Trileptal  300 mg down from 4 times a day down to 3 times a day. Hasn't noticed much change. On some days he doesn't take any pain medication.  He never pursued his sleep study, he has an old CPAP, claims not needed since he doesn't snore. Labs in June 2024 Trileptal  28, sodium was 132, glucose 154.   Update 06/05/24 SS: Here with his wife, last visit we decreased his Trileptal  300 mg TID, down to BID. Pain management is managing Lyrica  200 mg TID. Has recommended hydrocodone , but he hasn't due to constipation. Labs last visit Trileptal  27, glucose 133, sodium level normal 138. Most pain is in shins, constant ache/burning. Not as active, mentions his toes are clenched, hard to walk on his feet. Hasn't driven the last few months.   REVIEW OF SYSTEMS: Out of a complete 14 system review of symptoms, the patient complains only of the following symptoms, and all other reviewed systems are negative.  See HPI  ALLERGIES: Allergies  Allergen Reactions   Ace Inhibitors     Other Reaction(s): cough   Statins     Other Reaction(s): myalgias    HOME MEDICATIONS: Outpatient Medications Prior to Visit  Medication Sig Dispense Refill   amLODipine (NORVASC) 2.5 MG tablet Take 2.5 mg by mouth daily.      atenolol  (TENORMIN ) 50 MG tablet Take 50 mg by mouth daily.  11   Blood Glucose Monitoring Suppl (FREESTYLE LITE) w/Device KIT Dx: E11.42. use to check blood sugar finger stick twice a day     doxycycline (ADOXA) 100 MG tablet Take 100 mg by mouth 2 (two) times daily.     gemfibrozil (LOPID) 600 MG tablet Take 600 mg by mouth daily.      glucose blood (ONE TOUCH ULTRA TEST) test strip Check cbgs 4 times a day 100 each 12   losartan  (COZAAR ) 100 MG tablet Take 100 mg by mouth  daily.      MOUNJARO 7.5 MG/0.5ML Pen SMARTSIG:7.5 Milligram(s) SUB-Q Once a Week     mupirocin  ointment (BACTROBAN ) 2 % Apply 1 Application topically 2 (two) times daily. 30 g 2   OXcarbazepine  (TRILEPTAL ) 150 MG tablet Take 2 tablets (300 mg total) by mouth 2 (two) times daily. 240 tablet 11   pregabalin  (LYRICA ) 200 MG capsule TAKE 1 CAPSULE BY MOUTH 5 TIMES DAILY 150 capsule 1   No facility-administered medications prior to visit.    PAST MEDICAL HISTORY: Past Medical History:  Diagnosis Date   Anxiety    Arthritis    Diabetes (HCC)    High cholesterol    Neuropathic pain of lower extremity    Sleep apnea    CPAP     over 5 years ago   does not know where done    PAST SURGICAL HISTORY: Past Surgical History:  Procedure Laterality Date   APPLICATION OF ROBOTIC ASSISTANCE FOR SPINAL PROCEDURE N/A 10/06/2016   Procedure: APPLICATION OF ROBOTIC ASSISTANCE FOR SPINAL PROCEDURE;  Surgeon: Lanis Pupa, MD;  Location: [facility] OR;  Service: Neurosurgery;  Laterality: N/A;   Back Surgeries  2012   Total 3 back sirgeries   BACK SURGERY     Spinal AV fistula   EYE SURGERY Bilateral 2017   cataracts removed   Right foot/ ankle reconstruction      FAMILY HISTORY: Family History  Problem Relation Age of Onset   Cancer Mother    Cancer Father    Neuropathy Neg Hx     SOCIAL HISTORY: Social History   Socioeconomic History   Marital status: Married    Spouse name: Arland   Number of children: 2   Years  of education: 12   Highest education level: Not on file  Occupational History    Employer: [employer]  Tobacco Use   Smoking status: Former    Current packs/day: 0.00    Average packs/day: 0.3 packs/day for 35.0 years (8.8 ttl pk-yrs)    Types: Cigarettes    Start date: 10/06/1981    Quit date: 10/06/2016    Years since quitting: 7.6   Smokeless tobacco: Never  Vaping Use   Vaping status: Never Used  Substance and Sexual Activity   Alcohol use: No    Alcohol/week: 0.0 standard drinks of alcohol    Comment: quit 4-5 months ago   Drug use: No   Sexual activity: Not on file  Other Topics Concern   Not on file  Social History Narrative   Patient is married and lives at home with wife.    Patient has a barista.   Caffeine none   Right-handed.      Social Drivers of Health   Tobacco Use: Medium Risk (06/05/2024)   Patient History    Smoking Tobacco Use: Former    Smokeless Tobacco Use: Never    Passive Exposure: Not on Actuary Strain: Not on file  Food Insecurity: Not on file  Transportation Needs: Not on file  Physical Activity: Not on file  Stress: Not on file  Social Connections: Not on file  Intimate Partner Violence: Not on file  Depression (EYV7-0): Not on file  Alcohol Screen: Not on file  Housing: Not on file  Utilities: Not on file  Health Literacy: Not on file   PHYSICAL EXAM  Vitals:   06/05/24 0837  BP: 136/70  SpO2: 96%  Weight: 272 lb (123.4 kg)  Height:  6' 4 (1.93 m)    Body mass index is 33.11 kg/m.  NEUROLOGICAL EXAM:  MENTAL STATUS: Speech/Cognition: Awake, alert, normal speech, oriented to history taking and casual conversation.  CRANIAL NERVES: CN II: Visual fields are full to confrontation.  Pupils are round equal and briskly reactive to light. CN III, IV, VI: extraocular movement are normal. No ptosis. CN V: Facial sensation is intact to light touch. CN VII: Face is symmetric with  normal eye closure and smile. CN VIII: Hearing is normal to casual conversation CN XI: Head turning and shoulder shrug are intact  MOTOR: Mild bilateral hip flexion weakness, mild bilateral dorsiflexion weakness  REFLEXES: Reflexes are 2 at upper extremity, 3 at bilateral knees   SENSORY: Intact bilaterally to soft touch to face, arms, legs  COORDINATION: Finger-nose-finger and heel-to-shin is normal bilaterally, but slow with the legs  GAIT/STANCE: Has to push off from seated position to stand, gait is wide-based, cautious,  uses walker, appears unsteady, when initially stands, knees are bent, flexed, loosens up with walking  DIAGNOSTIC DATA (LABS, IMAGING, TESTING) - I reviewed patient records, labs, notes, testing and imaging myself where available.  Lab Results  Component Value Date   WBC 5.8 05/31/2023   HGB 14.4 05/31/2023   HCT 41.5 05/31/2023   MCV 89 05/31/2023   PLT 182 05/31/2023      Component Value Date/Time   NA 138 05/31/2023 0907   K 4.5 05/31/2023 0907   CL 101 05/31/2023 0907   CO2 20 05/31/2023 0907   GLUCOSE 133 (H) 05/31/2023 0907   GLUCOSE 132 (H) 10/12/2016 0235   BUN 16 05/31/2023 0907   CREATININE 0.96 05/31/2023 0907   CALCIUM  9.8 05/31/2023 0907   PROT 8.0 05/31/2023 0907   ALBUMIN  5.0 (H) 05/31/2023 0907   AST 26 05/31/2023 0907   ALT 22 05/31/2023 0907   ALKPHOS 82 05/31/2023 0907   BILITOT 0.3 05/31/2023 0907   GFRNONAA 94 11/10/2016 0952   GFRAA 108 11/10/2016 0952   No results found for: CHOL, HDL, LDLCALC, LDLDIRECT, TRIG, CHOLHDL Lab Results  Component Value Date   HGBA1C 5.6 09/28/2016   Lab Results  Component Value Date   VITAMINB12 646 03/23/2017   Lab Results  Component Value Date   TSH 1.580 03/23/2017   ASSESSMENT AND PLAN 75 y.o. year old male    1.  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  Bilateral lower extremity neuropathic pain,  3.  Low  back pain status post decompression for left lumbar radiculopathy 4.  Polypharmacy treatment 5.  History of OSA on CPAP  -Reports more pain, last visit we reduced Trileptal  from 300 mg 3 times daily to twice daily, historically his sodium level has been on the low end.  Will recheck today, if tolerable will increase Trileptal  back to 3 times daily.  Continue follow-up with pain management who manages Lyrica .  He is already at max dose.  He has not been able to tolerate hydrocodone  due to severe constipation.  He has not been as active as in the last several months, suspect this has contribution to his decline in mobility.  He refuses physical therapy. Encouraged to continue remain active, exercise, careful with ambulation. Follow-up with me in 1 year or sooner if needed  Lauraine Born, SCHARLENE, DNP  [facility] [address], [address] [place], [place] [address] [phone]  "

## 2024-06-05 ENCOUNTER — Encounter: Payer: Self-pay | Admitting: Neurology

## 2024-06-05 ENCOUNTER — Ambulatory Visit (INDEPENDENT_AMBULATORY_CARE_PROVIDER_SITE_OTHER): Payer: Commercial Managed Care - PPO | Admitting: Neurology

## 2024-06-05 VITALS — BP 136/70 | Ht 76.0 in | Wt 272.0 lb

## 2024-06-05 DIAGNOSIS — M792 Neuralgia and neuritis, unspecified: Secondary | ICD-10-CM

## 2024-06-05 DIAGNOSIS — R269 Unspecified abnormalities of gait and mobility: Secondary | ICD-10-CM

## 2024-06-05 DIAGNOSIS — G5793 Unspecified mononeuropathy of bilateral lower limbs: Secondary | ICD-10-CM

## 2024-06-05 NOTE — Patient Instructions (Signed)
 Check labs today, will consider increasing Trileptal .  Try to increase activity, exercise.  Consider physical therapy.  Continue follow-up with pain management.

## 2024-06-06 LAB — COMPREHENSIVE METABOLIC PANEL WITH GFR
ALT: 26 IU/L (ref 0–44)
AST: 30 IU/L (ref 0–40)
Albumin: 4.9 g/dL — ABNORMAL HIGH (ref 3.8–4.8)
Alkaline Phosphatase: 95 IU/L (ref 47–123)
BUN/Creatinine Ratio: 16 (ref 10–24)
BUN: 17 mg/dL (ref 8–27)
Bilirubin Total: 0.4 mg/dL (ref 0.0–1.2)
CO2: 24 mmol/L (ref 20–29)
Calcium: 10.1 mg/dL (ref 8.6–10.2)
Chloride: 98 mmol/L (ref 96–106)
Creatinine, Ser: 1.05 mg/dL (ref 0.76–1.27)
Globulin, Total: 3.2 g/dL (ref 1.5–4.5)
Glucose: 205 mg/dL — ABNORMAL HIGH (ref 70–99)
Potassium: 5.3 mmol/L — ABNORMAL HIGH (ref 3.5–5.2)
Sodium: 139 mmol/L (ref 134–144)
Total Protein: 8.1 g/dL (ref 6.0–8.5)
eGFR: 75 mL/min/1.73

## 2024-06-09 ENCOUNTER — Ambulatory Visit: Payer: Self-pay | Admitting: Neurology

## 2024-08-04 ENCOUNTER — Ambulatory Visit: Admitting: Podiatry

## 2025-06-09 ENCOUNTER — Ambulatory Visit: Admitting: Neurology
